# Patient Record
Sex: Male | Born: 1980 | Race: Black or African American | Hispanic: No | Marital: Single | Smoking: Never smoker
Health system: Southern US, Community
[De-identification: ages and names within clinical notes are randomized; demographics above are authoritative.]

---
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
## 2002-05-09 ENCOUNTER — Encounter: Payer: Self-pay | Admitting: Emergency Medicine

## 2002-05-09 ENCOUNTER — Inpatient Hospital Stay (HOSPITAL_COMMUNITY): Admission: EM | Admit: 2002-05-09 | Discharge: 2002-05-10 | Payer: Self-pay | Admitting: *Deleted

## 2006-06-08 ENCOUNTER — Emergency Department (HOSPITAL_COMMUNITY): Admission: EM | Admit: 2006-06-08 | Discharge: 2006-06-08 | Payer: Self-pay | Admitting: Emergency Medicine

## 2008-08-16 ENCOUNTER — Emergency Department (HOSPITAL_COMMUNITY): Admission: EM | Admit: 2008-08-16 | Discharge: 2008-08-16 | Payer: Self-pay | Admitting: *Deleted

## 2010-01-19 ENCOUNTER — Ambulatory Visit: Payer: Self-pay | Admitting: Internal Medicine

## 2010-02-18 ENCOUNTER — Emergency Department (HOSPITAL_COMMUNITY): Admission: EM | Admit: 2010-02-18 | Discharge: 2010-02-19 | Payer: Self-pay | Admitting: Emergency Medicine

## 2010-11-03 ENCOUNTER — Emergency Department (HOSPITAL_COMMUNITY): Payer: No Typology Code available for payment source

## 2010-11-03 ENCOUNTER — Emergency Department (HOSPITAL_COMMUNITY)
Admission: EM | Admit: 2010-11-03 | Discharge: 2010-11-03 | Disposition: A | Discharge status: Home / Self Care | Payer: No Typology Code available for payment source | Attending: Emergency Medicine | Admitting: Emergency Medicine

## 2010-11-03 DIAGNOSIS — M546 Pain in thoracic spine: Secondary | ICD-10-CM | POA: Insufficient documentation

## 2010-11-03 DIAGNOSIS — M545 Low back pain, unspecified: Secondary | ICD-10-CM | POA: Insufficient documentation

## 2010-11-03 DIAGNOSIS — Z79899 Other long term (current) drug therapy: Secondary | ICD-10-CM | POA: Insufficient documentation

## 2010-11-03 DIAGNOSIS — I252 Old myocardial infarction: Secondary | ICD-10-CM | POA: Insufficient documentation

## 2010-11-03 DIAGNOSIS — Z7982 Long term (current) use of aspirin: Secondary | ICD-10-CM | POA: Insufficient documentation

## 2010-11-04 LAB — URINE MICROSCOPIC-ADD ON

## 2010-11-04 LAB — LACTIC ACID, PLASMA: Lactic Acid, Venous: 1 mmol/L (ref 0.5–2.2)

## 2010-11-04 LAB — CBC
MCV: 73.3 fL — ABNORMAL LOW (ref 78.0–100.0)
Platelets: 218 10*3/uL (ref 150–400)
RBC: 5.92 MIL/uL — ABNORMAL HIGH (ref 4.22–5.81)
WBC: 9.2 10*3/uL (ref 4.0–10.5)

## 2010-11-04 LAB — URINALYSIS, ROUTINE W REFLEX MICROSCOPIC
Bilirubin Urine: NEGATIVE
Hgb urine dipstick: NEGATIVE
Ketones, ur: NEGATIVE mg/dL
Protein, ur: 30 mg/dL — AB
Urobilinogen, UA: 1 mg/dL (ref 0.0–1.0)

## 2010-11-04 LAB — RAPID URINE DRUG SCREEN, HOSP PERFORMED
Amphetamines: NOT DETECTED
Barbiturates: NOT DETECTED
Benzodiazepines: NOT DETECTED
Cocaine: NOT DETECTED
Opiates: NOT DETECTED

## 2010-11-04 LAB — POCT CARDIAC MARKERS
CKMB, poc: <1 ng/mL — ABNORMAL LOW (ref 1.0–8.0)
Myoglobin, poc: 56.8 ng/mL (ref 12–200)
Troponin i, poc: <0.05 ng/mL (ref 0.00–0.09)

## 2010-11-04 LAB — DIFFERENTIAL
Eosinophils Relative: 2 % (ref 0–5)
Lymphocytes Relative: 26 % (ref 12–46)
Lymphs Abs: 2.4 10*3/uL (ref 0.7–4.0)
Neutro Abs: 6.1 10*3/uL (ref 1.7–7.7)

## 2010-11-04 LAB — BASIC METABOLIC PANEL
Chloride: 105 mEq/L (ref 96–112)
Creatinine, Ser: 1 mg/dL (ref 0.4–1.5)
GFR calc Af Amer: >60 mL/min (ref >60)
Potassium: 3.8 mEq/L (ref 3.5–5.1)

## 2011-01-04 NOTE — Discharge Summary (Signed)
NAME:  Luis Short, Luis Short                         ACCOUNT NO.:  [id number]   MEDICAL RECORD NO.:  [id number]                   PATIENT TYPE:  INP   LOCATION:  5507                                 FACILITY:  [facility]   PHYSICIAN:  Raymon Mutton, P.A.              DATE OF BIRTH:  October 30, 1980   DATE OF ADMISSION:  05/09/2002  DATE OF DISCHARGE:  05/10/2002                                 DISCHARGE SUMMARY   DISCHARGE DIAGNOSES:  1. Chest pain, resolved at admission, suspected coronary spasm.  2. History of coronary spasm, treated with Norvasc in the past.  3. Status post cardiac catheterization in January 2002 with normal     coronaries.   HISTORY OF PRESENT ILLNESS:  The patient is a 30 year old African-American  gentleman who is a patient of Dr. Tresa Endo. The last time he was seen in our  office in January 2003, and was found to be stable from a cardiovascular  standpoint. The patient was then maintained on a 5 mg daily dose of Norvasc  and he stopped it and also stopped aspirin.   He presented to the emergency room on May 09, 2002, with complaints of  chest pain. At the time of presentation his EKG did not show any changes and  was normal sinus rhythm, and there were some nonspecific STT wave changes in  the precordial leads, V1, V4. The patient was examined by Dr. Tresa Endo and  admitted under the MI protocol.   HOSPITAL COURSE:  He was started on Imdur 30 mg q.d. and developed  significant headache from that so Norvasc 2.5 mg was added, and the patient  the next morning was found pain free with stable vital signs, blood pressure  of 102 systolic over 54, pulse 68, respirations 20, and he was  afebrile.  His O2 saturations were 98% on room air.   His clinical studies, cardiac enzymes x 3 were negative. His hemoglobin was  13, hematocrit 39.7, WBC count 9.6 and platelets 201. His lipid profile was  absolutely favorable. Sodium 142, potassium 4.0, chloride 107, CO2 31, BUN  12,  creatinine 0.9 and serum glucose 79. His urinalysis was unremarkable  except ketones 15.   The patient was assessed by Dr. Tresa Endo and deemed stable for discharge to  home.  His condition on discharge was improved and stable.   DISCHARGE INSTRUCTIONS:  Discharge diet, low fat, low salt, low cholesterol  diet. Discharge activity as tolerated, avoiding  emotional and physical  exertion until seen in the office.   FOLLOW UP:  He will have a Cardiolite  stress test on May 14, 2002, at  11:30 in our  office, and then an appointment with Dr. Lorenz Coaster will follow up  on May 27, 2002, at 4 p.m.    DISCHARGE MEDICATIONS:  1. Norvasc 5 mg p.o. q.d.  2. Enteric coated aspirin 81 mg p.o. q.d.  3. Protonix 40 mg q.d.  Raymon Mutton, P.A.    MK/MEDQ  D:  05/10/2002  T:  05/12/2002  Job:  [id number]

## 2011-01-04 NOTE — Discharge Summary (Signed)
[place]  Patient:    Luis Short, Short                      MRN: [id number] Adm. Date:  [id number] Disc. Date: 08/29/00 Attending:  Virgina Evener Dictator:   Halford Decamp. Delanna Ahmadi, R.N., N.P.                           Discharge Summary  HISTORY OF PRESENT ILLNESS:  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  He apparently had chest pain the day before admission and the day of admission in the morning.  It was recurrent.  It was substernal through to his back.  HOSPITAL COURSE:  An EKG was done and it showed acute MI with J point elevation inferolateral and T wave down precordially.  The pain was resolved after sublingual nitroglycerin x 3.  His enzymes came back which were greatly elevated with a CK of 782 and MB 65, and troponin of 19.73.  Thus he was admitted and he was put on IV heparin and IV nitroglycerin.  He continued to have pain intermittently throughout the night. The morning of August 25, 2000, it was decided that he should undergo cardiac catheterization which was performed by Dr. Tresa Endo.  He had no obstructive CAD.  He had a separate ostia to the LAD and circumflex vessels.  His EF was 57%. He had a subtle posterolateral posterior hypocontractility.  It was felt that symptoms and elevated CKs and EKG were related to a coronary vasospasm. Thus he was placed on calcium channel blocker, aspirin, and Plavix.  A two-dimensional echocardiogram was performed which revealed LV size normal, LV function was normal.  There was no LV wall motion abnormalities.  His valves were all normal.  Right ventricle was normal.  Right atrium was normal.  There was no pericardial effusion and the pericardium appeared to be normal.  He continued to have chest pain after the heart catheterization.  He was given morphine and his IV  nitroglycerin was increased and he was given Toradol.  He did have a component of chest wall tenderness.  His activity level was increased.  On August 29, 2000, he was seen by Runell Gess, M.D. who had a long discussion with him about his diet, any illicit drug use, physical activity, and medication compliance.  He apparently did not want to take his medications in the hospital because he felt they made him sleepy.  Prior to his hospitalization, he had some extreme dietary behaviors secondary to his wrestling competition.  He would cut down his food and take laxatives to try to lose weight.  A drug screen was performed and this was all negative.  He was considered stable upon discharge without any further chest discomfort. His EKG on January 8, showed resolved T wave inversions in V2 and V3.  He continued with some J point elevation in the inferolateral leads.  LABORATORY DATA:  Hemoglobin electrophoresis was performed which was normal. On August 28, 2000, his sodium was 137, potassium 4.0, BUN 11, creatinine 0.9.  Homosystein level was 7.56, cholesterol 65, triglycerides 15, HDL 44, LDL 18.  CK-MB #1 782/65 with troponin 19, #2 696/65 with troponin 23, #3 295/12.8 with troponin of 8.21.  LFTs were within normal limits.  ESR was 1. Sickle  hemoglobin was 14.5 within normal limits.  Sickle screen was negative. His pro time, INR, and PTT were all within normal limits on admission. Magnesium was 1.9 on August 25, 2000.  Iron was 93, TIBC 352, T4 7.4, TSH 4.77. Drug screen was negative.  Urinalysis was negative for any pathology. On admission sodium 141, potassium 3.5, chloride 103, BUN 11.  DISCHARGE MEDICATIONS: 1. Enteric-coated aspirin 325 mg once per day. 2. Norvasc 5 mg once per day. 3. Plavix 75 mg once per day. 4. Nitroglycerin one under the tongue every five minutes if needed for    chest pain.  He may drive.  He is to do no strenuous activity, no physical exercise, and  no wrestling until released and no physical education.  He is to be on a regular diet.  He is to follow up with Dr. Tresa Endo on September 16, 2000, at 3:50 p.m.  DISCHARGE DIAGNOSES: 1. 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 2. Abnormal electrocardiography with J point elevation inferolaterally with    resolved T wave inversions in V2 through V3 at time of discharge. 3. Musculoskeletal chest wall pain. 4. Hypokalemia while in the hospital with replacement and normal levels. 5. One episode of nonsustained supraventricular tachycardia during    hospitalization. 6. Two-dimensional echocardiogram with normal left ventricular function, no    pericardial effusion, normal valves. 7. Status post cardiac catheterization with normal coronary arteries. He    does have an abnormality of separate ostium of his left coronary artery    and circumflex. DD:  08/29/00 TD:  08/29/00 Job: [id number]

## 2011-01-04 NOTE — Cardiovascular Report (Signed)
. [facility]  Patient:    Luis Short, Luis Short                      MRN: [id number] Adm. Date:  [id number] Attending:  Virgina Evener                        Cardiac Catheterization  INDICATIONS:  [name] is an 30 year old black gentleman who is a senior in high school, currently on the wrestling team.  Recently, he has been trying to reduce his weight to wrestle at 112 pounds.  He has been dieting, not eating much at all, and also, there has been some use of laxatives.  On Saturday, August 23, 2000, he developed initial onset of chest pressure; this ultimately resolved.  On Sunday morning at 9 a.m., he developed recurrent chest pressure associated with a smothering sensation.  He continued to experience this chest pain and smothering sensation all day.  At approximately 1 a.m., he presented to the emergency room, greater than 16 hours after persistent chest pain onset.  ECG showed J point elevations/ST segment elevation in leads II, III and V, lead I, V4 through V6, with T wave inversion in leads V1 through V3.  He was given nitroglycerin and ultimately after three sublingual nitroglycerin, chest pain promptly resolved.  Subsequent CPK came back elevated at 782 with an MB fraction of 65.  Troponins were elevated at 19.7.  The patient has been heparinized and had IC nitroglycerin without recurrent chest pain.  Potassium was low at 3.3.  He is taken to the catheterization laboratory to assess for potential obstructive disease/coronary vasospasm or potential anomalous coronary artery.  HEMODYNAMIC DATA:  Central aortic pressure is 112/79; left ventricular pressure 112/25.  ANGIOGRAPHIC DATA:  There were separate ostia to the LAD and circumflex vessels.  The LAD gave rise to a proximal large diagonal vessel.  There were several perforating arteries.  Mid-distal LAD seemed to dip intramyocardially.  The LAD wrapped around the LV apex.  There  was no obstructive disease.  The circumflex vessel had a separate ostium.  It gave rise to one major marginal vessel with several branches.  The circumflex and its branches were normal.  The right coronary artery was a large dominant vessel.  It gave rise to a moderate-size PDA and posterolateral system as well as an acute marginal branch.  There was no significant obstructive disease.  Biplane cine left ventriculography revealed preserved global LV function. Ejection fraction was 57%.  On the RAO projection, there was a very subtle area of very small focal mid-anterolateral and mid-posterior hypocontractility.  On the LAO projection, there appeared to be a subtle area of low-to-mid-posterolateral hypocontractility.  IMPRESSION 1. Preserved global left ventricular function with subtle    posterolateral/posterior hypocontractility. 2. No evidence for obstructive coronary disease, with separate ostia of the    left anterior descending and left circumflex vessel off the left coronary    cusp.  DISCUSSION:  [name] presented with chest pain of greater than 16 hours duration.  He had diffuse J point elevation of approximately 2 mm inferolaterally as well as in lead I; he also had T wave inversion.  There were no prior ECGs to compare and although the J point elevation did suggest the possibility of early repolarization and the T wave inversion may have also been contributed to persistent benign early repolarization; however, with the patients persistent chest pain  and increased CPK with troponins, it was felt more likely that this was due to transient coronary vasospasm leading to symptomatology.  A 2-D echocardiographic Doppler study will also be performed for further evaluation of valvular architecture and pericardium. DD:  08/25/00 TD:  08/25/00 Job: [id number]

## 2011-05-23 LAB — DIFFERENTIAL
Basophils Relative: 0 % (ref 0–1)
Eosinophils Absolute: 0.1 10*3/uL (ref 0.0–0.7)
Eosinophils Relative: 1 % (ref 0–5)
Lymphs Abs: 2.9 10*3/uL (ref 0.7–4.0)
Monocytes Absolute: 0.8 10*3/uL (ref 0.1–1.0)
Monocytes Relative: 8 % (ref 3–12)
Neutrophils Relative %: 62 % (ref 43–77)

## 2011-05-23 LAB — URINE CULTURE: Colony Count: 15000

## 2011-05-23 LAB — CBC
Hemoglobin: 14.5 g/dL (ref 13.0–17.0)
RBC: 6.27 MIL/uL — ABNORMAL HIGH (ref 4.22–5.81)
RDW: 14.5 % (ref 11.5–15.5)

## 2011-05-23 LAB — COMPREHENSIVE METABOLIC PANEL
ALT: 15 U/L (ref 0–53)
AST: 22 U/L (ref 0–37)
Alkaline Phosphatase: 63 U/L (ref 39–117)
Calcium: 9.7 mg/dL (ref 8.4–10.5)
GFR calc Af Amer: >60 mL/min (ref >60)
Glucose, Bld: 89 mg/dL (ref 70–99)
Potassium: 3.6 mEq/L (ref 3.5–5.1)
Sodium: 141 mEq/L (ref 135–145)
Total Protein: 7.1 g/dL (ref 6.0–8.3)

## 2011-05-23 LAB — POCT I-STAT, CHEM 8
Calcium, Ion: 1.17 mmol/L (ref 1.12–1.32)
Creatinine, Ser: 0.9 mg/dL (ref 0.4–1.5)
Glucose, Bld: 87 mg/dL (ref 70–99)
HCT: 49 % (ref 39.0–52.0)
Hemoglobin: 16.7 g/dL (ref 13.0–17.0)
Potassium: 3.5 mEq/L (ref 3.5–5.1)
TCO2: 28 mmol/L (ref 0–100)

## 2011-05-23 LAB — URINALYSIS, ROUTINE W REFLEX MICROSCOPIC
Glucose, UA: NEGATIVE mg/dL
Hgb urine dipstick: NEGATIVE
Ketones, ur: NEGATIVE mg/dL
Protein, ur: NEGATIVE mg/dL
pH: 6 (ref 5.0–8.0)

## 2011-06-10 ENCOUNTER — Emergency Department (HOSPITAL_COMMUNITY)
Admission: EM | Admit: 2011-06-10 | Discharge: 2011-06-11 | Disposition: A | Discharge status: Home / Self Care | Payer: Self-pay | Attending: Emergency Medicine | Admitting: Emergency Medicine

## 2011-06-10 DIAGNOSIS — L299 Pruritus, unspecified: Secondary | ICD-10-CM | POA: Insufficient documentation

## 2011-06-10 DIAGNOSIS — R21 Rash and other nonspecific skin eruption: Secondary | ICD-10-CM | POA: Insufficient documentation

## 2011-06-10 DIAGNOSIS — I252 Old myocardial infarction: Secondary | ICD-10-CM | POA: Insufficient documentation

## 2011-06-26 ENCOUNTER — Encounter: Payer: Self-pay | Admitting: Emergency Medicine

## 2011-06-26 DIAGNOSIS — R21 Rash and other nonspecific skin eruption: Secondary | ICD-10-CM | POA: Insufficient documentation

## 2011-06-26 NOTE — ED Notes (Signed)
Pt c/o rash all over st's was here for same several weeks ago and given RX for same.  Pt st's when he ran out of meds rash returned Pt st's rash burns and stings

## 2011-06-27 ENCOUNTER — Encounter (HOSPITAL_COMMUNITY): Payer: Self-pay | Admitting: Emergency Medicine

## 2011-06-27 ENCOUNTER — Emergency Department (HOSPITAL_COMMUNITY)
Admission: EM | Admit: 2011-06-27 | Discharge: 2011-06-27 | Disposition: A | Discharge status: Home / Self Care | Payer: No Typology Code available for payment source | Attending: Emergency Medicine | Admitting: Emergency Medicine

## 2011-06-27 DIAGNOSIS — R21 Rash and other nonspecific skin eruption: Secondary | ICD-10-CM

## 2011-06-27 MED ORDER — FAMOTIDINE 20 MG PO TABS: 20.0000 mg | ORAL_TABLET | Freq: Two times a day (BID) | ORAL | Status: DC

## 2011-06-27 MED ORDER — CETIRIZINE-PSEUDOEPHEDRINE ER 5-120 MG PO TB12: 1.0000 | ORAL_TABLET | Freq: Every day | ORAL | Status: AC

## 2011-06-27 MED ORDER — FAMOTIDINE 20 MG PO TABS
ORAL_TABLET | ORAL | Status: AC
  Administered 2011-06-27: 20 mg via ORAL
  Filled 2011-06-27: qty 1

## 2011-06-27 MED ORDER — HYDROXYZINE HCL 25 MG PO TABS
25.0000 mg | ORAL_TABLET | Freq: Once | ORAL | Status: AC
  Administered 2011-06-27: 25 mg via ORAL
  Filled 2011-06-27 (×2): qty 1

## 2011-06-27 MED ORDER — FAMOTIDINE 20 MG PO TABS
20.0000 mg | ORAL_TABLET | Freq: Once | ORAL | Status: AC
  Administered 2011-06-27: 20 mg via ORAL
  Filled 2011-06-27: qty 1

## 2011-06-27 MED ORDER — HYDROXYZINE HCL 25 MG PO TABS: 25.0000 mg | ORAL_TABLET | Freq: Four times a day (QID) | ORAL | Status: AC

## 2011-06-27 NOTE — ED Notes (Signed)
Pharmacy notified of medication needed.

## 2011-06-27 NOTE — ED Provider Notes (Signed)
History     CSN: [id number] Arrival date & time: 06/27/2011 12:50 AM   First MD Initiated Contact with Patient 06/27/11 0124      Chief Complaint  Patient presents with  . Rash     HPI History provided by the patient. He presents with continued complaints of rash over entire body. Rash has been present for the past month or longer. Rash is rather small bumps non-erythematous with diffuse itching. Patient had previous visits to the emergency room.  He was treated with a prescription of prednisone and Benadryl which he reports has not helped.  Patient denies fever chills nausea vomiting. No shortness of breath or throat tightness.   History reviewed. No pertinent past medical history.  History reviewed. No pertinent past surgical history.  No family history on file.  History  Substance Use Topics  . Smoking status: Never Smoker   . Smokeless tobacco: Not on file  . Alcohol Use: No      Review of Systems  Constitutional: Negative for fever and chills.  Respiratory: Negative for cough and shortness of breath.   Skin: Positive for rash.  All other systems reviewed and are negative.    Allergies  Review of patient's allergies indicates no known allergies.  Home Medications  No current outpatient prescriptions on file.  BP 117/66  Pulse 67  Temp(Src) 97.7 F (36.5 C) (Oral)  Resp 16  SpO2 99%  Physical Exam  Constitutional: He is oriented to person, place, and time. He appears well-developed and well-nourished. No distress.  HENT:  Head: Normocephalic.  Neck: Normal range of motion. Neck supple.  Cardiovascular: Normal rate.   No murmur heard. Pulmonary/Chest: Effort normal and breath sounds normal. No stridor.  Abdominal: Soft.  Musculoskeletal: Normal range of motion.  Neurological: He is alert and oriented to person, place, and time.  Skin: Skin is warm. Rash noted. No erythema.       Diffuse papular rash over entire body. no erythema.  Psychiatric: He  has a normal mood and affect.    ED Course  Procedures (including critical care time)  Labs Reviewed - No data to display No results found.   1. Rash      MDM          Angus Seller, PA 06/27/11 [phone]

## 2011-06-27 NOTE — ED Provider Notes (Signed)
Medical screening examination/treatment/procedure(s) were performed by non-physician practitioner and as supervising physician I was immediately available for consultation/collaboration.  Alyzah Pelly K Jerusalem Brownstein-Rasch, MD 06/27/11 2320 

## 2012-07-13 ENCOUNTER — Encounter (HOSPITAL_COMMUNITY): Payer: Self-pay | Admitting: *Deleted

## 2012-07-13 ENCOUNTER — Emergency Department (HOSPITAL_COMMUNITY)
Admission: EM | Admit: 2012-07-13 | Discharge: 2012-07-14 | Disposition: A | Discharge status: Home / Self Care | Payer: Self-pay | Attending: Emergency Medicine | Admitting: Emergency Medicine

## 2012-07-13 DIAGNOSIS — A64 Unspecified sexually transmitted disease: Secondary | ICD-10-CM | POA: Insufficient documentation

## 2012-07-13 DIAGNOSIS — R3915 Urgency of urination: Secondary | ICD-10-CM | POA: Insufficient documentation

## 2012-07-13 DIAGNOSIS — R110 Nausea: Secondary | ICD-10-CM | POA: Insufficient documentation

## 2012-07-13 LAB — URINALYSIS, ROUTINE W REFLEX MICROSCOPIC
Bilirubin Urine: NEGATIVE
Hgb urine dipstick: NEGATIVE
Protein, ur: NEGATIVE mg/dL
Urobilinogen, UA: 1 mg/dL (ref 0.0–1.0)

## 2012-07-13 LAB — COMPREHENSIVE METABOLIC PANEL
AST: 29 U/L (ref 0–37)
BUN: 10 mg/dL (ref 6–23)
CO2: 30 mEq/L (ref 19–32)
Calcium: 9.9 mg/dL (ref 8.4–10.5)
Creatinine, Ser: 0.87 mg/dL (ref 0.50–1.35)
GFR calc Af Amer: >90 mL/min (ref >90)
GFR calc non Af Amer: >90 mL/min (ref >90)

## 2012-07-13 LAB — CBC WITH DIFFERENTIAL/PLATELET
Eosinophils Relative: 1 % (ref 0–5)
Lymphs Abs: 2.3 10*3/uL (ref 0.7–4.0)
MCH: 23.7 pg — ABNORMAL LOW (ref 26.0–34.0)
MCV: 71.5 fL — ABNORMAL LOW (ref 78.0–100.0)
Monocytes Absolute: 0.5 10*3/uL (ref 0.1–1.0)
Platelets: UNDETERMINED 10*3/uL (ref 150–400)
RBC: 6.25 MIL/uL — ABNORMAL HIGH (ref 4.22–5.81)
RDW: 14.3 % (ref 11.5–15.5)

## 2012-07-13 LAB — URINE MICROSCOPIC-ADD ON

## 2012-07-13 MED ORDER — IOHEXOL 300 MG/ML  SOLN: 20.0000 mL | INTRAMUSCULAR | Status: DC

## 2012-07-13 MED ORDER — MORPHINE SULFATE 4 MG/ML IJ SOLN
4.0000 mg | Freq: Once | INTRAMUSCULAR | Status: DC
  Filled 2012-07-13: qty 1

## 2012-07-13 NOTE — ED Notes (Signed)
MD at bedside. 

## 2012-07-13 NOTE — ED Provider Notes (Signed)
History     CSN: [id number]  Arrival date & time 07/13/12  2150   First MD Initiated Contact with Patient 07/13/12 2248      Chief Complaint  Patient presents with  . Abdominal Pain    (Consider location/radiation/quality/duration/timing/severity/associated sxs/prior treatment) Patient is a 31 y.o. male presenting with abdominal pain. The history is provided by the patient. No language interpreter was used.  Abdominal Pain The primary symptoms of the illness include abdominal pain and nausea. The primary symptoms of the illness do not include fever, shortness of breath, vomiting, diarrhea or dysuria. The current episode started more than 2 days ago. The onset of the illness was gradual. The problem has been gradually worsening.  Additional symptoms associated with the illness include urgency. Symptoms associated with the illness do not include constipation, hematuria or frequency. Significant associated medical issues include GERD. Significant associated medical issues do not include diabetes or diverticulitis.  R flank and R Lower abdominal pain/pulling intermitant x 2 weeks with urgency.  Patient states he is sexually active but uses condoms.  States he has been tested for stds since he last had intercourse.  Denies penile discharge. Denies N & V or fever.   He thinks he has a UTI today.   History reviewed. No pertinent past medical history.  History reviewed. No pertinent past surgical history.  No family history on file.  History  Substance Use Topics  . Smoking status: Never Smoker   . Smokeless tobacco: Not on file  . Alcohol Use: No      Review of Systems  Constitutional: Negative.  Negative for fever.  HENT: Negative.   Eyes: Negative.   Respiratory: Negative.  Negative for shortness of breath.   Cardiovascular: Negative.   Gastrointestinal: Positive for nausea and abdominal pain. Negative for vomiting, diarrhea and constipation.  Genitourinary: Positive for  urgency. Negative for dysuria, frequency, hematuria, scrotal swelling, difficulty urinating, penile pain and testicular pain.  Musculoskeletal:       Flank pain R  Skin: Negative for rash.  Neurological: Negative.  Negative for dizziness and seizures.  Psychiatric/Behavioral: Negative.   All other systems reviewed and are negative.    Allergies  Review of patient's allergies indicates no known allergies.  Home Medications  No current outpatient prescriptions on file.  BP 123/73  Pulse 68  Temp 98.2 F (36.8 C)  Resp 18  SpO2 100%  Physical Exam  Nursing note and vitals reviewed. Constitutional: He is oriented to person, place, and time. He appears well-developed and well-nourished.  HENT:  Head: Normocephalic.  Eyes: Conjunctivae normal and EOM are normal. Pupils are equal, round, and reactive to light.  Neck: Normal range of motion. Neck supple.  Cardiovascular: Normal rate.   Pulmonary/Chest: Effort normal and breath sounds normal. No respiratory distress.  Abdominal: Soft. Bowel sounds are normal. He exhibits no distension. There is tenderness. There is no rebound and no guarding.  Genitourinary: Penis normal. No penile tenderness.  Musculoskeletal: Normal range of motion.  Neurological: He is alert and oriented to person, place, and time.  Skin: Skin is warm and dry.  Psychiatric: He has a normal mood and affect.    ED Course  Procedures (including critical care time)  Labs Reviewed  URINALYSIS, ROUTINE W REFLEX MICROSCOPIC - Abnormal; Notable for the following:    APPearance CLOUDY (*)     Leukocytes, UA SMALL (*)     All other components within normal limits  CBC WITH DIFFERENTIAL - Abnormal; Notable for  the following:    RBC 6.25 (*)     MCV 71.5 (*)     MCH 23.7 (*)     All other components within normal limits  COMPREHENSIVE METABOLIC PANEL  URINE MICROSCOPIC-ADD ON   No results found.   No diagnosis found.    MDM  Migrating dull lower  abdominal pain x 2 weeks with WBC in his urine.  Treated for std with rocephen, azithromycin and rx for doxycycline.  Patient was instructed to follow up at the free std clinic with no intercourse until recheck and his partner is treated.  He understands to return to the ER for worsening pain, fever, n/v.    Labs Reviewed  URINALYSIS, ROUTINE W REFLEX MICROSCOPIC - Abnormal; Notable for the following:    APPearance CLOUDY (*)     Leukocytes, UA SMALL (*)     All other components within normal limits  CBC WITH DIFFERENTIAL - Abnormal; Notable for the following:    RBC 6.25 (*)     MCV 71.5 (*)     MCH 23.7 (*)     All other components within normal limits  COMPREHENSIVE METABOLIC PANEL  URINE MICROSCOPIC-ADD ON  LAB REPORT - SCANNED          Remi Haggard, NP 07/14/12 1131

## 2012-07-13 NOTE — ED Notes (Signed)
The pt has had abd pain and soreness for 2 weeks.  He thinks he has a urinary iinfection.  No nv or diarrhea

## 2012-07-14 MED ORDER — AZITHROMYCIN 250 MG PO TABS
1000.0000 mg | ORAL_TABLET | Freq: Once | ORAL | Status: AC
  Administered 2012-07-14: 1000 mg via ORAL
  Filled 2012-07-14: qty 1

## 2012-07-14 MED ORDER — CEFTRIAXONE SODIUM 250 MG IJ SOLR
250.0000 mg | Freq: Once | INTRAMUSCULAR | Status: AC
  Administered 2012-07-14: 250 mg via INTRAMUSCULAR
  Filled 2012-07-14: qty 250

## 2012-07-14 MED ORDER — LIDOCAINE HCL (PF) 1 % IJ SOLN
INTRAMUSCULAR | Status: AC
  Administered 2012-07-14: 0.09 mL
  Filled 2012-07-14: qty 5

## 2012-07-14 MED ORDER — DOXYCYCLINE HYCLATE 100 MG PO CAPS: 100.0000 mg | ORAL_CAPSULE | Freq: Two times a day (BID) | ORAL | Status: DC

## 2012-07-14 NOTE — ED Provider Notes (Signed)
Medical screening examination/treatment/procedure(s) were performed by non-physician practitioner and as supervising physician I was immediately available for consultation/collaboration.  Cheri Guppy, MD 07/14/12 [phone]

## 2012-07-14 NOTE — ED Notes (Signed)
Patient is alert and orientedx4.  Patient was explained discharge instructions and he understood them with no questions. 

## 2012-07-14 NOTE — ED Provider Notes (Signed)
Medical screening examination/treatment/procedure(s) were performed by non-physician practitioner and as supervising physician I was immediately available for consultation/collaboration.  Tyan Lasure, MD 07/14/12 0650 

## 2013-12-13 ENCOUNTER — Encounter (HOSPITAL_COMMUNITY): Payer: Self-pay | Admitting: Emergency Medicine

## 2013-12-13 ENCOUNTER — Emergency Department (HOSPITAL_COMMUNITY)
Admission: EM | Admit: 2013-12-13 | Discharge: 2013-12-13 | Disposition: A | Discharge status: Home / Self Care | Payer: Self-pay | Attending: Emergency Medicine | Admitting: Emergency Medicine

## 2013-12-13 ENCOUNTER — Emergency Department (HOSPITAL_COMMUNITY): Payer: Self-pay

## 2013-12-13 DIAGNOSIS — M62838 Other muscle spasm: Secondary | ICD-10-CM | POA: Insufficient documentation

## 2013-12-13 MED ORDER — KETOROLAC TROMETHAMINE 60 MG/2ML IM SOLN
60.0000 mg | Freq: Once | INTRAMUSCULAR | Status: AC
  Administered 2013-12-13: 60 mg via INTRAMUSCULAR
  Filled 2013-12-13: qty 2

## 2013-12-13 MED ORDER — METHOCARBAMOL 500 MG PO TABS
1000.0000 mg | ORAL_TABLET | Freq: Once | ORAL | Status: AC
  Administered 2013-12-13: 1000 mg via ORAL
  Filled 2013-12-13: qty 2

## 2013-12-13 MED ORDER — TRAMADOL HCL 50 MG PO TABS: 50.0000 mg | ORAL_TABLET | Freq: Four times a day (QID) | ORAL | Status: DC | PRN

## 2013-12-13 MED ORDER — IBUPROFEN 800 MG PO TABS: 800.0000 mg | ORAL_TABLET | Freq: Three times a day (TID) | ORAL | Status: DC

## 2013-12-13 MED ORDER — METHOCARBAMOL 500 MG PO TABS: 500.0000 mg | ORAL_TABLET | Freq: Two times a day (BID) | ORAL | Status: DC

## 2013-12-13 NOTE — ED Notes (Signed)
The pt is c/o numbness and tingling in  Both arms legs feet  For one month.  No known injury

## 2013-12-13 NOTE — ED Provider Notes (Addendum)
CSN: [id number]     Arrival date & time 12/13/13  0043 History   First MD Initiated Contact with Patient 12/13/13 0118     Chief Complaint  Patient presents with  . Numbness     (Consider location/radiation/quality/duration/timing/severity/associated sxs/prior Treatment) The history is provided by the patient. No language interpreter was used.  Patient complains of pain along the border of the right trapezius muscle and paresthesias of the entire body x 1 month.  No weakness.  No bowel or bladder incontinence.  No changes in vision or speech no CP, No SOB.  No trauma.  No HA.  No IVDA.    History reviewed. No pertinent past medical history. History reviewed. No pertinent past surgical history. No family history on file. History  Substance Use Topics  . Smoking status: Never Smoker   . Smokeless tobacco: Not on file  . Alcohol Use: No    Review of Systems  Genitourinary: Negative for dysuria and difficulty urinating.  Musculoskeletal: Negative for arthralgias, back pain, gait problem, joint swelling, myalgias and neck stiffness.  Skin: Negative for rash.  Neurological: Negative for dizziness, tremors, syncope, facial asymmetry, speech difficulty, weakness, light-headedness and headaches.  All other systems reviewed and are negative.     Allergies  Review of patient's allergies indicates no known allergies.  Home Medications   Prior to Admission medications   Not on File   BP 112/72  Pulse 60  Temp(Src) 98.2 F (36.8 C) (Oral)  Resp 18  SpO2 97% Physical Exam  Constitutional: He is oriented to person, place, and time. He appears well-developed and well-nourished. No distress.  HENT:  Head: Normocephalic and atraumatic.  Mouth/Throat: Oropharynx is clear and moist.  Eyes: Conjunctivae and EOM are normal. Pupils are equal, round, and reactive to light.  Neck: Normal range of motion. Neck supple.  Spasm in the right trapezius muscle  Cardiovascular: Normal rate,  regular rhythm and intact distal pulses.   Pulmonary/Chest: Effort normal and breath sounds normal. He has no wheezes. He has no rales.  Abdominal: Soft. Bowel sounds are normal. There is no tenderness. There is no rebound and no guarding.  Musculoskeletal: Normal range of motion. He exhibits no edema and no tenderness.  Lymphadenopathy:    He has no cervical adenopathy.  Neurological: He is alert and oriented to person, place, and time. He has normal reflexes. He displays normal reflexes. No cranial nerve deficit. He exhibits normal muscle tone. Coordination normal.  L5/ s1 intact intact perineal sensation.  Intact gait.  5/5 strength in all 4 extremities.  Sensation tested in all 4 extremities and intact.    Skin: Skin is warm and dry.  Psychiatric: He has a normal mood and affect.    ED Course  Procedures (including critical care time) Labs Review Labs Reviewed - No data to display  Imaging Review Dg Cervical Spine Complete  12/13/2013   CLINICAL DATA:  Numbness and tingling in both arms, and in the legs and feet. Right neck pain.  EXAM: CERVICAL SPINE  4+ VIEWS  COMPARISON:  CT of the cervical spine performed 06/08/2006  FINDINGS: There is no evidence of fracture or subluxation. Vertebral bodies demonstrate normal height and alignment. Intervertebral disc spaces are preserved. Prevertebral soft tissues are within normal limits. The provided odontoid view demonstrates no significant abnormality.  The visualized lung apices are clear.  IMPRESSION: No evidence of fracture or subluxation along the cervical spine.   Electronically Signed   By: Beryle BeamsJeffery  Chang M.D.  On: 12/13/2013 01:37     EKG Interpretation None      MDM   Final diagnoses:  None    No high risk behavior, no trauma.  No fevers, no weight loss.  No focal deficits. Exam and vitals benign and reassuring.  No vision loss No eye pain.  No indication for MRI at this time.  Follow up with your family doctor will treat for  muscle spasm.  Return for weakness, inability to pass urine or stool, fevers, or vision or speech changes    Tritia Endo K Reford Olliff-Rasch, MD 12/13/13 0151  Brooklynne Pereida K Bobbie Virden-Rasch, MD 12/13/13 [phone]

## 2013-12-13 NOTE — ED Notes (Signed)
Pain in his rt neck also

## 2014-06-16 ENCOUNTER — Emergency Department (HOSPITAL_COMMUNITY): Payer: No Typology Code available for payment source

## 2014-06-16 ENCOUNTER — Encounter (HOSPITAL_COMMUNITY): Payer: Self-pay | Admitting: Emergency Medicine

## 2014-06-16 ENCOUNTER — Emergency Department (HOSPITAL_COMMUNITY)
Admission: EM | Admit: 2014-06-16 | Discharge: 2014-06-16 | Disposition: A | Discharge status: Home / Self Care | Payer: No Typology Code available for payment source | Attending: Emergency Medicine | Admitting: Emergency Medicine

## 2014-06-16 DIAGNOSIS — R202 Paresthesia of skin: Secondary | ICD-10-CM | POA: Insufficient documentation

## 2014-06-16 DIAGNOSIS — H538 Other visual disturbances: Secondary | ICD-10-CM | POA: Insufficient documentation

## 2014-06-16 DIAGNOSIS — R0789 Other chest pain: Secondary | ICD-10-CM | POA: Insufficient documentation

## 2014-06-16 LAB — BASIC METABOLIC PANEL
Anion gap: 10 (ref 5–15)
BUN: 14 mg/dL (ref 6–23)
CHLORIDE: 103 meq/L (ref 96–112)
CO2: 28 meq/L (ref 19–32)
Calcium: 9.2 mg/dL (ref 8.4–10.5)
Creatinine, Ser: 1.02 mg/dL (ref 0.50–1.35)
GFR calc Af Amer: >90 mL/min (ref >90)
GFR calc non Af Amer: >90 mL/min (ref >90)
GLUCOSE: 92 mg/dL (ref 70–99)
POTASSIUM: 3.9 meq/L (ref 3.7–5.3)
Sodium: 141 mEq/L (ref 137–147)

## 2014-06-16 LAB — URINALYSIS, ROUTINE W REFLEX MICROSCOPIC
Bilirubin Urine: NEGATIVE
GLUCOSE, UA: NEGATIVE mg/dL
HGB URINE DIPSTICK: NEGATIVE
Ketones, ur: 15 mg/dL — AB
Leukocytes, UA: NEGATIVE
Nitrite: NEGATIVE
PH: 6.5 (ref 5.0–8.0)
PROTEIN: NEGATIVE mg/dL
SPECIFIC GRAVITY, URINE: 1.033 — AB (ref 1.005–1.030)
Urobilinogen, UA: 1 mg/dL (ref 0.0–1.0)

## 2014-06-16 LAB — PROTIME-INR
INR: 1.22 (ref 0.00–1.49)
PROTHROMBIN TIME: 15.6 s — AB (ref 11.6–15.2)

## 2014-06-16 LAB — CBC
HEMATOCRIT: 40.1 % (ref 39.0–52.0)
HEMOGLOBIN: 13 g/dL (ref 13.0–17.0)
MCH: 23 pg — ABNORMAL LOW (ref 26.0–34.0)
MCHC: 32.4 g/dL (ref 30.0–36.0)
MCV: 70.8 fL — AB (ref 78.0–100.0)
Platelets: 201 10*3/uL (ref 150–400)
RBC: 5.66 MIL/uL (ref 4.22–5.81)
RDW: 14.1 % (ref 11.5–15.5)
WBC: 9.6 10*3/uL (ref 4.0–10.5)

## 2014-06-16 LAB — RAPID URINE DRUG SCREEN, HOSP PERFORMED
AMPHETAMINES: NOT DETECTED
Barbiturates: NOT DETECTED
Benzodiazepines: NOT DETECTED
Cocaine: NOT DETECTED
OPIATES: NOT DETECTED
Tetrahydrocannabinol: NOT DETECTED

## 2014-06-16 LAB — I-STAT TROPONIN, ED: TROPONIN I, POC: 0 ng/mL (ref 0.00–0.08)

## 2014-06-16 LAB — PRO B NATRIURETIC PEPTIDE: Pro B Natriuretic peptide (BNP): 7.9 pg/mL (ref 0–125)

## 2014-06-16 MED ORDER — NITROGLYCERIN 0.4 MG SL SUBL
0.4000 mg | SUBLINGUAL_TABLET | SUBLINGUAL | Status: DC | PRN
  Administered 2014-06-16: 0.4 mg via SUBLINGUAL
  Filled 2014-06-16: qty 1

## 2014-06-16 MED ORDER — KETOROLAC TROMETHAMINE 30 MG/ML IJ SOLN
30.0000 mg | Freq: Once | INTRAMUSCULAR | Status: AC
  Administered 2014-06-16: 30 mg via INTRAVENOUS
  Filled 2014-06-16: qty 1

## 2014-06-16 MED ORDER — ASPIRIN 325 MG PO TABS
325.0000 mg | ORAL_TABLET | ORAL | Status: AC
  Administered 2014-06-16: 325 mg via ORAL
  Filled 2014-06-16: qty 1

## 2014-06-16 NOTE — ED Provider Notes (Signed)
CSN: [id number]     Arrival date & time 06/16/14  0132 History   First MD Initiated Contact with Patient 06/16/14 0149     Chief Complaint  Patient presents with  . Chest Pain  . Blurred Vision     (Consider location/radiation/quality/duration/timing/severity/associated sxs/prior Treatment) HPI  This is a 33 year old male with no significant past medical history who presents with multiple complaints. Patient reports pains and paresthesias in the bilateral upper and lower extremities. This is been ongoing for approximately 2 weeks. He denies any headache. Patient reports chest pain and "feeling congested." He reports that it is a pulling sensation. It is mostly right-sided and nonradiating. Currently pain is 7 out of 10. No exertional component. Denies cough or fever. Patient also reports blurry vision. Denies any double vision.  History reviewed. No pertinent past medical history. History reviewed. No pertinent past surgical history. Family History  Problem Relation Age of Onset  . Cancer Mother   . Diabetes Mother   . Hypertension Father   . Hypertension Brother    History  Substance Use Topics  . Smoking status: Never Smoker   . Smokeless tobacco: Never Used  . Alcohol Use: No    Review of Systems  Constitutional: Negative.  Negative for fever and chills.  Eyes: Negative for visual disturbance.  Respiratory: Positive for chest tightness. Negative for shortness of breath.   Cardiovascular: Negative.  Negative for chest pain and leg swelling.  Gastrointestinal: Negative.  Negative for nausea, vomiting, abdominal pain and diarrhea.  Genitourinary: Negative.  Negative for dysuria.  Musculoskeletal: Negative for back pain.  Skin: Negative for rash.  Neurological: Positive for numbness. Negative for dizziness, syncope, speech difficulty, weakness, light-headedness and headaches.  All other systems reviewed and are negative.     Allergies  Review of patient's allergies  indicates no known allergies.  Home Medications   Prior to Admission medications   Not on File   BP 116/78  Pulse 59  Temp(Src) 99.2 F (37.3 C) (Oral)  Resp 14  Ht 5\' 7"  (1.702 m)  Wt 159 lb (72.122 kg)  BMI 24.90 kg/m2  SpO2 100% Physical Exam  Nursing note and vitals reviewed. Constitutional: He is oriented to person, place, and time. He appears well-developed and well-nourished. No distress.  HENT:  Head: Normocephalic and atraumatic.  Eyes: EOM are normal. Pupils are equal, round, and reactive to light.  Cardiovascular: Normal rate, regular rhythm and normal heart sounds.   No murmur heard. Pulmonary/Chest: Effort normal and breath sounds normal. No respiratory distress. He has no wheezes. He exhibits no tenderness.  Abdominal: Soft. Bowel sounds are normal. There is no tenderness. There is no rebound and no guarding.  Musculoskeletal: He exhibits no edema.  Neurological: He is alert and oriented to person, place, and time.  5 out of 5 strength in all 4 extremities, sensation intact, visual fields intact  Skin: Skin is warm and dry.  Psychiatric: He has a normal mood and affect.    ED Course  Procedures (including critical care time) Labs Review Labs Reviewed  CBC - Abnormal; Notable for the following:    MCV 70.8 (*)    MCH 23.0 (*)    All other components within normal limits  PROTIME-INR - Abnormal; Notable for the following:    Prothrombin Time 15.6 (*)    All other components within normal limits  URINALYSIS, ROUTINE W REFLEX MICROSCOPIC - Abnormal; Notable for the following:    APPearance CLOUDY (*)    Specific  Gravity, Urine 1.033 (*)    Ketones, ur 15 (*)    All other components within normal limits  BASIC METABOLIC PANEL  PRO B NATRIURETIC PEPTIDE  URINE RAPID DRUG SCREEN (HOSP PERFORMED)  I-STAT TROPOININ, ED    Imaging ReviewRosezena Sensor Dg Chest Port 1 View  06/16/2014   CLINICAL DATA:  Right-sided chest pain and numbness.  EXAM: PORTABLE CHEST - 1 VIEW   COMPARISON:  06/08/2006  FINDINGS: Normal heart size and mediastinal contours. No acute infiltrate or edema. No effusion or pneumothorax. No acute osseous findings.  IMPRESSION: No active disease.   Electronically Signed   By: Tiburcio PeaJonathan  Watts M.D.   On: 06/16/2014 02:48     EKG Interpretation   Date/Time:  Thursday June 16 2014 01:38:50 EDT Ventricular Rate:  79 PR Interval:  186 QRS Duration: 88 QT Interval:  366 QTC Calculation: 419 R Axis:   81 Text Interpretation:  Sinus rhythm with occasional Premature ventricular  complexes Abnormal ECG No significant change was found Confirmed by CAMPOS   MD, KEVIN ([id number]) on 06/16/2014 1:40:31 AM      MDM   Final diagnoses:  Paresthesia  Other chest pain    Patient presents with multiple complaints including paresthesias and chest pain. They have been ongoing for the last 2 weeks. EKG shows diffuse ST elevation but is unchanged from prior. He is nontoxic on exam. Chest x-ray and workup is largely reassuring. No obvious focal neurologic deficit. Visual acuity checked and normal.  On recheck, patient resting comfortably. Without complaint. He does not have a primary physician. At this time unsure the etiology of the patient's complaints but do not find an acute medical emergency. We'll have him follow-up with [facility].  After history, exam, and medical workup I feel the patient has been appropriately medically screened and is safe for discharge home. Pertinent diagnoses were discussed with the patient. Patient was given return precautions.     Shon Batonourtney F Horton, MD 06/16/14 [phone]

## 2014-06-16 NOTE — ED Notes (Signed)
Patient presents stating he has been having numbness and tingling in his arms and legs for about 2 weeks.  Also noticed his vision has been getting blurry.  Tonight stated he was getting up to get something and his left chest started hurting more.  Has been uncomfortable for the past 2 weeks but seemed to be worse tonight.

## 2014-06-16 NOTE — ED Notes (Addendum)
No complaints voiced at this time.

## 2014-06-16 NOTE — Discharge Instructions (Signed)
Chest Pain (Nonspecific) °It is often hard to give a specific diagnosis for the cause of chest pain. There is always a chance that your pain could be related to something serious, such as a heart attack or a blood clot in the lungs. You need to follow up with your health care provider for further evaluation. °CAUSES  °· Heartburn. °· Pneumonia or bronchitis. °· Anxiety or stress. °· Inflammation around your heart (pericarditis) or lung (pleuritis or pleurisy). °· A blood clot in the lung. °· A collapsed lung (pneumothorax). It can develop suddenly on its own (spontaneous pneumothorax) or from trauma to the chest. °· Shingles infection (herpes zoster virus). °The chest wall is composed of bones, muscles, and cartilage. Any of these can be the source of the pain. °· The bones can be bruised by injury. °· The muscles or cartilage can be strained by coughing or overwork. °· The cartilage can be affected by inflammation and become sore (costochondritis). °DIAGNOSIS  °Lab tests or other studies may be needed to find the cause of your pain. Your health care provider may have you take a test called an ambulatory electrocardiogram (ECG). An ECG records your heartbeat patterns over a 24-hour period. You may also have other tests, such as: °· Transthoracic echocardiogram (TTE). During echocardiography, sound waves are used to evaluate how blood flows through your heart. °· Transesophageal echocardiogram (TEE). °· Cardiac monitoring. This allows your health care provider to monitor your heart rate and rhythm in real time. °· Holter monitor. This is a portable device that records your heartbeat and can help diagnose heart arrhythmias. It allows your health care provider to track your heart activity for several days, if needed. °· Stress tests by exercise or by giving medicine that makes the heart beat faster. °TREATMENT  °· Treatment depends on what may be causing your chest pain. Treatment may include: °¨ Acid blockers for  heartburn. °¨ Anti-inflammatory medicine. °¨ Pain medicine for inflammatory conditions. °¨ Antibiotics if an infection is present. °· You may be advised to change lifestyle habits. This includes stopping smoking and avoiding alcohol, caffeine, and chocolate. °· You may be advised to keep your head raised (elevated) when sleeping. This reduces the chance of acid going backward from your stomach into your esophagus. °Most of the time, nonspecific chest pain will improve within 2-3 days with rest and mild pain medicine.  °HOME CARE INSTRUCTIONS  °· If antibiotics were prescribed, take them as directed. Finish them even if you start to feel better. °· For the next few days, avoid physical activities that bring on chest pain. Continue physical activities as directed. °· Do not use any tobacco products, including cigarettes, chewing tobacco, or electronic cigarettes. °· Avoid drinking alcohol. °· Only take medicine as directed by your health care provider. °· Follow your health care provider's suggestions for further testing if your chest pain does not go away. °· Keep any follow-up appointments you made. If you do not go to an appointment, you could develop lasting (chronic) problems with pain. If there is any problem keeping an appointment, call to reschedule. °SEEK MEDICAL CARE IF:  °· Your chest pain does not go away, even after treatment. °· You have a rash with blisters on your chest. °· You have a fever. °SEEK IMMEDIATE MEDICAL CARE IF:  °· You have increased chest pain or pain that spreads to your arm, neck, jaw, back, or abdomen. °· You have shortness of breath. °· You have an increasing cough, or you cough   up blood. °· You have severe back or abdominal pain. °· You feel nauseous or vomit. °· You have severe weakness. °· You faint. °· You have chills. °This is an emergency. Do not wait to see if the pain will go away. Get medical help at once. Call your local emergency services (911 in [place] Do not drive  yourself to the hospital. °MAKE SURE YOU:  °· Understand these instructions. °· Will watch your condition. °· Will get help right away if you are not doing well or get worse. °Document Released: 05/15/2005 Document Revised: 08/10/2013 Document Reviewed: 03/10/2008 °[employer]® Patient Information ©2015 [employer]. This information is not intended to replace advice given to you by your health care provider. Make sure you discuss any questions you have with your health care provider. °Paresthesia °Paresthesia is an abnormal burning or prickling sensation. This sensation is generally felt in the hands, arms, legs, or feet. However, it may occur in any part of the body. It is usually not painful. The feeling may be described as: °· Tingling or numbness. °· "Pins and needles." °· Skin crawling. °· Buzzing. °· Limbs "falling asleep." °· Itching. °Most people experience temporary (transient) paresthesia at some time in their lives. °CAUSES  °Paresthesia may occur when you breathe too quickly (hyperventilation). It can also occur without any apparent cause. Commonly, paresthesia occurs when pressure is placed on a nerve. The feeling quickly goes away once the pressure is removed. For some people, however, paresthesia is a long-lasting (chronic) condition caused by an underlying disorder. The underlying disorder may be: °· A traumatic, direct injury to nerves. Examples include a: °· Broken (fractured) neck. °· Fractured skull. °· A disorder affecting the brain and spinal cord (central nervous system). Examples include: °· Transverse myelitis. °· Encephalitis. °· Transient ischemic attack. °· Multiple sclerosis. °· Stroke. °· Tumor or blood vessel problems, such as an arteriovenous malformation pressing against the brain or spinal cord. °· A condition that damages the peripheral nerves (peripheral neuropathy). Peripheral nerves are not part of the brain and spinal cord. These conditions include: °· Diabetes. °· Peripheral  vascular disease. °· Nerve entrapment syndromes, such as carpal tunnel syndrome. °· Shingles. °· Hypothyroidism. °· Vitamin B12 deficiencies. °· Alcoholism. °· Heavy metal poisoning (lead, arsenic). °· Rheumatoid arthritis. °· Systemic lupus erythematosus. °DIAGNOSIS  °Your caregiver will attempt to find the underlying cause of your paresthesia. Your caregiver may: °· Take your medical history. °· Perform a physical exam. °· Order various lab tests. °· Order imaging tests. °TREATMENT  °Treatment for paresthesia depends on the underlying cause. °HOME CARE INSTRUCTIONS °· Avoid drinking alcohol. °· You may consider massage or acupuncture to help relieve your symptoms. °· Keep all follow-up appointments as directed by your caregiver. °SEEK IMMEDIATE MEDICAL CARE IF:  °· You feel weak. °· You have trouble walking or moving. °· You have problems with speech or vision. °· You feel confused. °· You cannot control your bladder or bowel movements. °· You feel numbness after an injury. °· You faint. °· Your burning or prickling feeling gets worse when walking. °· You have pain, cramps, or dizziness. °· You develop a rash. °MAKE SURE YOU: °· Understand these instructions. °· Will watch your condition. °· Will get help right away if you are not doing well or get worse. °Document Released: 07/26/2002 Document Revised: 10/28/2011 Document Reviewed: 04/26/2011 °ExitCare® Patient Information ©2015 [employer]. This information is not intended to replace advice given to you by your health care provider. Make sure you   discuss any questions you have with your health care provider. ° °

## 2014-06-16 NOTE — ED Notes (Signed)
Discharge instructions given  Voiced understanding.   

## 2014-06-16 NOTE — ED Notes (Signed)
Pt. reports right chest pain with mild SOB and tingling at fingers and toes onset 2 weeks ago. Denies nausea or diaphoresis .

## 2014-07-16 ENCOUNTER — Emergency Department (HOSPITAL_COMMUNITY)
Admission: EM | Admit: 2014-07-16 | Discharge: 2014-07-16 | Disposition: A | Discharge status: Home / Self Care | Payer: No Typology Code available for payment source | Attending: Emergency Medicine | Admitting: Emergency Medicine

## 2014-07-16 ENCOUNTER — Encounter (HOSPITAL_COMMUNITY): Payer: Self-pay | Admitting: *Deleted

## 2014-07-16 DIAGNOSIS — R51 Headache: Secondary | ICD-10-CM | POA: Insufficient documentation

## 2014-07-16 DIAGNOSIS — H539 Unspecified visual disturbance: Secondary | ICD-10-CM | POA: Insufficient documentation

## 2014-07-16 DIAGNOSIS — R202 Paresthesia of skin: Secondary | ICD-10-CM | POA: Insufficient documentation

## 2014-07-16 DIAGNOSIS — M545 Low back pain: Secondary | ICD-10-CM | POA: Insufficient documentation

## 2014-07-16 NOTE — ED Provider Notes (Signed)
CSN: [id number]     Arrival date & time 07/16/14  0154 History   First MD Initiated Contact with Patient 07/16/14 0217     Chief Complaint  Patient presents with  . multiple symptoms      (Consider location/radiation/quality/duration/timing/severity/associated sxs/prior Treatment) HPI Comments: He returns to the ED for complaints of back and chest pain, sharp tingling sensation in the arms and legs, cloudy vision and headache for the past 3 weeks. Initially seen on 06/16/14 for same, discharged home with "no diagnosis". He has not had any outpatient follow up. No fever, vomiting, cough, diarrhea, congestion or sore throat.   The history is provided by the patient. No language interpreter was used.    History reviewed. No pertinent past medical history. History reviewed. No pertinent past surgical history. Family History  Problem Relation Age of Onset  . Cancer Mother   . Diabetes Mother   . Hypertension Father   . Hypertension Brother    History  Substance Use Topics  . Smoking status: Never Smoker   . Smokeless tobacco: Never Used  . Alcohol Use: No    Review of Systems  Constitutional: Negative for fever and chills.  Eyes: Positive for visual disturbance.       See HPI.  Respiratory: Negative.   Cardiovascular: Negative.   Gastrointestinal: Negative.  Negative for nausea and abdominal pain.  Musculoskeletal:       See HPI.  Skin: Negative.  Negative for color change and rash.  Neurological: Positive for headaches.       See HPI.      Allergies  Review of patient's allergies indicates no known allergies.  Home Medications   Prior to Admission medications   Not on File   BP 113/84 mmHg  Pulse 72  Temp(Src) 98 F (36.7 C) (Oral)  Resp 17  Ht 5\' 7"  (1.702 m)  Wt 150 lb (68.04 kg)  BMI 23.49 kg/m2  SpO2 100% Physical Exam  Constitutional: He is oriented to person, place, and time. He appears well-developed and well-nourished.  HENT:  Head:  Normocephalic and atraumatic.  Eyes: EOM are normal. Pupils are equal, round, and reactive to light.  Neck: Normal range of motion.  Cardiovascular: Normal rate and regular rhythm.   No murmur heard. Pulmonary/Chest: Effort normal and breath sounds normal. He has no wheezes. He has no rales.  Abdominal: Soft. There is no tenderness.  Musculoskeletal: He exhibits no edema.  Neurological: He is alert and oriented to person, place, and time. He has normal strength and normal reflexes. No sensory deficit. He displays a negative Romberg sign.  CN's 3-12 grossly intact. Romberg negative, no pronator drift. Equal sensation bilateral upper and lower extremities. Full strength without lateralizing deficits. Normal coordination. Ambulatory without ataxia.   Skin: Skin is warm and dry.  Psychiatric: He has a normal mood and affect.    ED Course  Procedures (including critical care time) Labs Review Labs Reviewed - No data to display  Imaging Review No results found.   EKG Interpretation None      MDM   Final diagnoses:  None    1. Paresthesia  VSS. Nomal neurologic exam without abnormalities. Symptoms for 3 weeks in otherwise healthy patient. No objective evidence concerning for acute event. Stable for discharge home and outpatient follow up.    Arnoldo HookerShari A Makailee Nudelman, PA-C 07/16/14 0400  Purvis SheffieldForrest Harrison, MD 07/16/14 [phone]

## 2014-07-16 NOTE — ED Notes (Signed)
PA at bedside.

## 2014-07-16 NOTE — Discharge Instructions (Signed)
Paresthesia °Paresthesia is an abnormal burning or prickling sensation. This sensation is generally felt in the hands, arms, legs, or feet. However, it may occur in any part of the body. It is usually not painful. The feeling may be described as: °· Tingling or numbness. °· "Pins and needles." °· Skin crawling. °· Buzzing. °· Limbs "falling asleep." °· Itching. °Most people experience temporary (transient) paresthesia at some time in their lives. °CAUSES  °Paresthesia may occur when you breathe too quickly (hyperventilation). It can also occur without any apparent cause. Commonly, paresthesia occurs when pressure is placed on a nerve. The feeling quickly goes away once the pressure is removed. For some people, however, paresthesia is a long-lasting (chronic) condition caused by an underlying disorder. The underlying disorder may be: °· A traumatic, direct injury to nerves. Examples include a: °¨ Broken (fractured) neck. °¨ Fractured skull. °· A disorder affecting the brain and spinal cord (central nervous system). Examples include: °¨ Transverse myelitis. °¨ Encephalitis. °¨ Transient ischemic attack. °¨ Multiple sclerosis. °¨ Stroke. °¨ Tumor or blood vessel problems, such as an arteriovenous malformation pressing against the brain or spinal cord. °· A condition that damages the peripheral nerves (peripheral neuropathy). Peripheral nerves are not part of the brain and spinal cord. These conditions include: °¨ Diabetes. °¨ Peripheral vascular disease. °¨ Nerve entrapment syndromes, such as carpal tunnel syndrome. °¨ Shingles. °¨ Hypothyroidism. °¨ Vitamin B12 deficiencies. °¨ Alcoholism. °¨ Heavy metal poisoning (lead, arsenic). °¨ Rheumatoid arthritis. °¨ Systemic lupus erythematosus. °DIAGNOSIS  °Your caregiver will attempt to find the underlying cause of your paresthesia. Your caregiver may: °· Take your medical history. °· Perform a physical exam. °· Order various lab tests. °· Order imaging tests. °TREATMENT    °Treatment for paresthesia depends on the underlying cause. °HOME CARE INSTRUCTIONS °· Avoid drinking alcohol. °· You may consider massage or acupuncture to help relieve your symptoms. °· Keep all follow-up appointments as directed by your caregiver. °SEEK IMMEDIATE MEDICAL CARE IF:  °· You feel weak. °· You have trouble walking or moving. °· You have problems with speech or vision. °· You feel confused. °· You cannot control your bladder or bowel movements. °· You feel numbness after an injury. °· You faint. °· Your burning or prickling feeling gets worse when walking. °· You have pain, cramps, or dizziness. °· You develop a rash. °MAKE SURE YOU: °· Understand these instructions. °· Will watch your condition. °· Will get help right away if you are not doing well or get worse. °Document Released: 07/26/2002 Document Revised: 10/28/2011 Document Reviewed: 04/26/2011 °ExitCare® Patient Information ©2015 [employer]. This information is not intended to replace advice given to you by your health care provider. Make sure you discuss any questions you have with your health care provider. ° ° °Emergency Department Resource Guide °1) Find a Doctor and Pay Out of Pocket °Although you won't have to find out who is covered by your insurance plan, it is a good idea to ask around and get recommendations. You will then need to call the office and see if the doctor you have chosen will accept you as a new patient and what types of options they offer for patients who are self-pay. Some doctors offer discounts or will set up payment plans for their patients who do not have insurance, but you will need to ask so you aren't surprised when you get to your appointment. ° °2) Contact Your Local Health Department °Not all health departments have doctors that can see   patients for sick visits, but many do, so it is worth a call to see if yours does. If you don't know where your local health department is, you can check in your phone  book. The CDC also has a tool to help you locate your state's health department, and many state websites also have listings of all of their local health departments. ° °3) Find a Walk-in Clinic °If your illness is not likely to be very severe or complicated, you may want to try a walk in clinic. These are popping up all over the country in pharmacies, drugstores, and shopping centers. They're usually staffed by nurse practitioners or physician assistants that have been trained to treat common illnesses and complaints. They're usually fairly quick and inexpensive. However, if you have serious medical issues or chronic medical problems, these are probably not your best option. ° °No Primary Care Doctor: °- Call [facility] at  832-8000 - they can help you locate a primary care doctor that  accepts your insurance, provides certain services, etc. °- Physician Referral Service- 1-800-533-3463 ° °Chronic Pain Problems: °Organization         Address  Phone   Notes  °[facility]  [phone] Patients need to be referred by their primary care doctor.  ° °Medication Assistance: °Organization         Address  Phone   Notes  °[place] [facility] [address] °[place], [place] [address] [phone]) [phone] --Must be a resident of [place] °-- Must have NO insurance coverage whatsoever (no Medicaid/ Medicare, etc.) °-- The pt. MUST have a primary care doctor that directs their care regularly and follows them in the community °  °[facility]  (866) 331-1348   °[employer]  (888) 892-1162   ° °Agencies that provide inexpensive medical care: °Organization         Address  Phone   Notes  °[facility]  ([phone]) [phone]   °[facility]    ([phone]) [phone]   °[facility] [address] °[place], [place] [address] [phone]) [phone]   °[facility] [address], °[place] ([phone]) [phone]   °[facility]    ([phone]) [phone]   °[facility]    ([phone]) [phone]   °[facility] ° [address], [place] Phone:  ([phone]) [phone], Fax:  [phone] Hours of Operation:  9 am - 6 pm, M-F.  Also accepts Medicaid/Medicare and self-pay.  °[facility] ° [address], [place] Phone: [phone], Fax: [phone]. Hours of Operation:  8:30 am - 5:30 pm, M-F.  Also accepts Medicaid and self-pay.  °[facility] [place] [address], [place] Phone: ([phone]) [phone]   °[facility] [address], [place], [place] ([phone], Ext. 123 Mondays & Thursdays: 7-9 AM.  First 15 patients are seen on a first come, first serve basis. °  ° °Medicaid-accepting [place] Providers: ° °Organization         Address  Phone   Notes  °[facility] [address], [place] [phone] Also accepts self-pay patients.  °[facility] [address], [place] ° ([phone]) [phone]   °[facility] [address],  ([phone]) [phone]   °[facility] [address] [place] [address],  (  [phone]   Renaye RakersVeita Bland [address], [place]   [phone] Only accepts [place] patients after they have their name applied to their card.   Self-Pay (no insurance) in [facility] [place]:  Organization         Address  Phone   Notes  Sickle Cell Patients, [facility] [address], [place] [phone]   [facility] [address], [place] [phone]   [facility]  [address],  [phone]   [facility]/Dr. Osei-Bonsu  [address] [place] [address], [place] or [phone], [place] [phone] Phone number for both [place] and [place] locations is the same.   [facility] [address], [place] [phone]   [facility] [address] [place] [address], [place] or [address] [phone]   [facility] [address], [place] [phone], phone; [phone], fax Sees patients 1st and 3rd Saturday of every month.  Must not qualify for public or private insurance (i.e. Medicaid, Medicare, [place] Health Choice, Veterans' Benefits)  Household income should be no more than 200% of the poverty level The clinic cannot treat you if you are pregnant or think you are pregnant  Sexually transmitted diseases are not treated at the clinic.

## 2014-07-16 NOTE — ED Notes (Signed)
The pt has multiple symptoms headache neck pain rt arm pain cloudy vision back pain for 3 weeks.  He has been seen for these same symptoms with no diagnosis in the past 3 weeks

## 2014-10-29 ENCOUNTER — Encounter (HOSPITAL_COMMUNITY): Payer: Self-pay | Admitting: Emergency Medicine

## 2014-10-29 ENCOUNTER — Emergency Department (HOSPITAL_COMMUNITY)
Admission: EM | Admit: 2014-10-29 | Discharge: 2014-10-29 | Disposition: A | Discharge status: Home / Self Care | Payer: Worker's Compensation | Attending: Emergency Medicine | Admitting: Emergency Medicine

## 2014-10-29 DIAGNOSIS — Y999 Unspecified external cause status: Secondary | ICD-10-CM | POA: Insufficient documentation

## 2014-10-29 DIAGNOSIS — Y288XXA Contact with other sharp object, undetermined intent, initial encounter: Secondary | ICD-10-CM | POA: Diagnosis not present

## 2014-10-29 DIAGNOSIS — Y939 Activity, unspecified: Secondary | ICD-10-CM | POA: Diagnosis not present

## 2014-10-29 DIAGNOSIS — S6991XA Unspecified injury of right wrist, hand and finger(s), initial encounter: Secondary | ICD-10-CM | POA: Diagnosis not present

## 2014-10-29 DIAGNOSIS — Y929 Unspecified place or not applicable: Secondary | ICD-10-CM | POA: Diagnosis not present

## 2014-10-29 LAB — RAPID HIV SCREEN (WH-MAU): SUDS RAPID HIV SCREEN: NONREACTIVE

## 2014-10-29 NOTE — ED Provider Notes (Signed)
CSN: [id number]     Arrival date & time 10/29/14  [id number] History   None    Chief Complaint  Patient presents with  . finger cut      (Consider location/radiation/quality/duration/timing/severity/associated sxs/prior Treatment) HPI Comments: Patient is a 34 yo M presenting to the ED for evaluation of a cut on his right index finger from a loose staple on the garbage can. He obtained the cut yesterday. He is also concerned because he obtained two small cuts on his right middle finger from the same staple 2 weeks ago. He states the area is sore, but otherwise without symptom. Patient's biggest concern is that multiple people have also injured themselves on the same staple and he may have contracted something. Patient is right hand dominant. Vaccinations UTD for age.     History reviewed. No pertinent past medical history. History reviewed. No pertinent past surgical history. Family History  Problem Relation Age of Onset  . Cancer Mother   . Diabetes Mother   . Hypertension Father   . Hypertension Brother    History  Substance Use Topics  . Smoking status: Never Smoker   . Smokeless tobacco: Never Used  . Alcohol Use: No    Review of Systems  All other systems reviewed and are negative.     Allergies  Review of patient's allergies indicates no known allergies.  Home Medications   Prior to Admission medications   Not on File   There were no vitals taken for this visit. Physical Exam  Constitutional: He is oriented to person, place, and time. He appears well-developed and well-nourished. No distress.  HENT:  Head: Normocephalic and atraumatic.  Right Ear: External ear normal.  Left Ear: External ear normal.  Nose: Nose normal.  Mouth/Throat: Oropharynx is clear and moist.  Eyes: Conjunctivae are normal.  Neck: Normal range of motion. Neck supple.  Cardiovascular: Normal rate, regular rhythm, normal heart sounds and intact distal pulses.   Pulmonary/Chest: Effort normal  and breath sounds normal.  Abdominal: Soft.  Musculoskeletal: Normal range of motion.       Right wrist: Normal.       Left wrist: Normal.       Left hand: Normal.       Hands: Neurological: He is alert and oriented to person, place, and time. He has normal strength. No sensory deficit. GCS eye subscore is 4. GCS verbal subscore is 5. GCS motor subscore is 6.  Skin: Skin is warm and dry. He is not diaphoretic.  Psychiatric: He has a normal mood and affect.  Nursing note and vitals reviewed.   ED Course  Procedures (including critical care time) Medications - No data to display  Labs Review Labs Reviewed - No data to display  Imaging Review No results found.   EKG Interpretation None      MDM   Final diagnoses:  Finger injury, right, initial encounter     NAD, non-toxic appearing, AAOx4.  Neurovascularly intact. Normal sensation. No evidence of compartment syndrome. Vaccinations UTD. Hepatitis and HIV panel sent at patient request. Symptomatic measures discussed. Wounds are healed and do not require laceration repair. No evidence of infection. Pt is hemodynamically stable w no complaints prior to dc. Patient is stable at time of discharge       Francee PiccoloJennifer Lacey Wallman, PA-C 10/29/14 1653  Loren Raceravid Yelverton, MD 10/30/14 [phone]

## 2014-10-29 NOTE — ED Notes (Signed)
Patient states an understanding to follow up with the referred office for follow up per discharge instructions.

## 2014-10-29 NOTE — Discharge Instructions (Signed)
Non-Sutured Laceration  A laceration is a cut or wound that goes through all layers of the skin and into the tissue just beneath the skin. Usually, these are stitched up or held together with tape or glue shortly after the injury occurred. However, if several or more hours have passed before getting care, too many germs (bacteria) get into the laceration. Stitching it closed would bring the risk of infection. If your health care provider feels your laceration is too old, it may be left open and then bandaged to allow healing from the bottom layer up.  HOME CARE INSTRUCTIONS   · Change the bandage (dressing) 2 times a day or as directed by your health care provider.  · If the dressing or packing gauze sticks, soak it off with soapy water.  · When you re-bandage your laceration, make sure that the dressing or packing gauze goes all the way to the bottom of the laceration. The top of the laceration is kept open so it can heal from the bottom up. There is less chance for infection with this method.  · Wash the area with soap and water 2 times a day to remove all the creams or ointments, if used. Rinse off the soap. Pat the area dry with a clean towel. Look for signs of infection, such as redness, swelling, or a red line that goes away from the laceration.  · Re-apply creams or ointments if they were used to bandage the laceration. This helps keep the bandage from sticking.  · If the bandage becomes wet, dirty, or has a bad smell, change it as soon as possible.  · Only take medicine as directed by your health care provider.  You might need a tetanus shot now if:  · You have no idea when you had the last one.  · You have never had a tetanus shot before.  · Your laceration had dirt in it.  · Your laceration was dirty, and your last tetanus shot was more than 7 years ago.  · Your laceration was clean, and your last tetanus shot was more than 10 years ago.  If you need a tetanus shot, and you decide not to get one, there is  a rare chance of getting tetanus. Sickness from tetanus can be serious. If you got a tetanus shot, your arm may swell and get red and warm to the touch at the shot site. This is common and not a problem.  SEEK MEDICAL CARE IF:   · You have redness, swelling, or increasing pain in the laceration.  · You notice a red line that goes away from your laceration.  · You have pus coming from the laceration.  · You have a fever.  · You notice a bad smell coming from the laceration or dressing.  · You notice something coming out of the laceration, such as wood or glass.  · Your laceration is on your hand or foot and you are unable to properly move a finger or toe.  · You have severe swelling around the laceration, causing pain and numbness.  · You notice a change in color in your arm, hand, leg, or foot.  MAKE SURE YOU:   · Understand these instructions.  · Will watch your condition.  · Will get help right away if you are not doing well or get worse.  Document Released: 07/03/2006 Document Revised: 08/10/2013 Document Reviewed: 01/23/2009  ExitCare® Patient Information ©2015 [employer]. This information is not intended to   replace advice given to you by your health care provider. Make sure you discuss any questions you have with your health care provider.

## 2014-10-29 NOTE — ED Notes (Signed)
Patient states he cut his middle finger two weeks ago on a machine at work and that his pointer finger was cut on a staple yesterday, 10/28/2014. He is concerned about the possible risk of someone else also having cut their finger on the same staple.

## 2014-10-31 LAB — HEPATITIS PANEL, ACUTE
HCV AB: NEGATIVE
HEP B S AG: NEGATIVE
Hep A IgM: NONREACTIVE
Hep B C IgM: NONREACTIVE

## 2014-11-03 ENCOUNTER — Ambulatory Visit (INDEPENDENT_AMBULATORY_CARE_PROVIDER_SITE_OTHER): Payer: Worker's Compensation | Admitting: Family Medicine

## 2014-11-03 ENCOUNTER — Ambulatory Visit: Payer: Self-pay

## 2014-11-03 VITALS — BP 114/66 | HR 72 | Temp 98.0°F | Resp 16 | Ht 68.5 in | Wt 148.0 lb

## 2014-11-03 DIAGNOSIS — D649 Anemia, unspecified: Secondary | ICD-10-CM

## 2014-11-03 DIAGNOSIS — R109 Unspecified abdominal pain: Secondary | ICD-10-CM

## 2014-11-03 DIAGNOSIS — R5383 Other fatigue: Secondary | ICD-10-CM

## 2014-11-03 DIAGNOSIS — S61219A Laceration without foreign body of unspecified finger without damage to nail, initial encounter: Secondary | ICD-10-CM

## 2014-11-03 DIAGNOSIS — G4489 Other headache syndrome: Secondary | ICD-10-CM

## 2014-11-03 DIAGNOSIS — R195 Other fecal abnormalities: Secondary | ICD-10-CM

## 2014-11-03 DIAGNOSIS — E860 Dehydration: Secondary | ICD-10-CM | POA: Diagnosis not present

## 2014-11-03 DIAGNOSIS — S61200A Unspecified open wound of right index finger without damage to nail, initial encounter: Secondary | ICD-10-CM | POA: Diagnosis not present

## 2014-11-03 DIAGNOSIS — S61209A Unspecified open wound of unspecified finger without damage to nail, initial encounter: Secondary | ICD-10-CM

## 2014-11-03 LAB — POCT URINALYSIS DIPSTICK
Bilirubin, UA: NEGATIVE
Blood, UA: NEGATIVE
Glucose, UA: NEGATIVE
Leukocytes, UA: NEGATIVE
Nitrite, UA: NEGATIVE
Spec Grav, UA: >=1.03
Urobilinogen, UA: 0.2
pH, UA: 6

## 2014-11-03 LAB — POCT CBC
Granulocyte percent: 60 % (ref 37–80)
HCT, POC: 45.7 % (ref 43.5–53.7)
Hemoglobin: 13.3 g/dL — AB (ref 14.1–18.1)
Lymph, poc: 2.4 (ref 0.6–3.4)
MCH, POC: 22.2 pg — AB (ref 27–31.2)
MCHC: 29.2 g/dL — AB (ref 31.8–35.4)
MCV: 75.9 fL — AB (ref 80–97)
MID (cbc): 0.4 (ref 0–0.9)
MPV: 7.7 fL (ref 0–99.8)
POC Granulocyte: 4.2 (ref 2–6.9)
POC LYMPH PERCENT: 34 %L (ref 10–50)
POC MID %: 6 %M (ref 0–12)
Platelet Count, POC: 203 10*3/uL (ref 142–424)
RBC: 6.01 M/uL (ref 4.69–6.13)
RDW, POC: 16.6 %
WBC: 7 10*3/uL (ref 4.6–10.2)

## 2014-11-03 LAB — POCT UA - MICROSCOPIC ONLY
Casts, Ur, LPF, POC: NEGATIVE
Crystals, Ur, HPF, POC: NEGATIVE
Yeast, UA: NEGATIVE

## 2014-11-03 NOTE — Progress Notes (Signed)
 Chief Complaint:  Chief Complaint  Patient presents with  . Hand Pain    Right index and middle finger, X Friday, scraped fingers on rusty staple, afraid of contagious disease    HPI: Luis Short is a 34 y.o. male who is here for generalized fatigue, nausea, stool changes, headache after he had touched as staple near the trash can and cut his fingers. He was seen in the ER on March 12 and testing was done for HIV , hepA,  C have a an hep B which are all negative. He is here today because since his lacerations of the finger from the staples he acquired during work has made him feeling not himself, he states he feels like he is been sick since the cut on his fingers.  He works at [employer] and the staple was out in [employer] , he thinks that more people cut their hand on the staples but nobody has come forth except for him. Again HIV, hepatitis testing were negative. He is worried that he might have an infectious disease from exposure to the staples with other people having their fingers being caught on it as well. Associated symptoms include: All over body numbness, He has diffuse headache a couple of hours after he touched the staples. He went to work and was trying to get water but his manager sent the patient home because of his HA, he took tylenol and went to sleep. Then he started to feel "hurt all over", he states He just doesn't feel right He has had nause but no vomitus, He is constipated, He feels bad and doe snot know why. He has had light yellow, bright color stools, he denies any blood in his stool. He has eating cookout and spaghetti  He feels constipated, no diarrhea. 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   He has a cousin and  niece who both has sickle cell anemia, he denies having sickle cell anemia himself or is a or has a sickle cell trait.   Below is the office visit from the ER  HPI Comments: Patient is a 34 yo M presenting to the ED for evaluation of a cut on his right index finger from a loose staple on the garbage can. He obtained the cut yesterday. He is also concerned because he obtained two small cuts on his right middle finger from the same staple 2 weeks ago. He states the area is sore, but otherwise without symptom. Patient's biggest concern is that multiple people have also injured themselves on the same staple and he may have contracted something. Patient is right hand dominant. Vaccinations UTD for age.   History reviewed. No pertinent past medical history. History reviewed. No pertinent past surgical history. History   Social History  . Marital Status: Single    Spouse Name: N/A  . Number of Children: N/A  . Years of Education: N/A   Social History Main Topics  . Smoking status: Never Smoker   . Smokeless tobacco: Never Used  . Alcohol Use: No  . Drug Use: No  . Sexual Activity: Not on file   Other Topics Concern  . None   Social History Narrative  Family History  Problem Relation Age of Onset  . Cancer Mother   . Diabetes Mother   . Hypertension Father   . Hypertension Brother    No Known Allergies Prior to Admission medications   Not on File     ROS: The patient denies fevers, chills, night sweats, unintentional weight loss, chest pain, palpitations, wheezing, dyspnea on exertion, nausea, vomiting, abdominal pain, dysuria, hematuria, melena, numbness, weakness, or tingling.  All other systems have been reviewed and were otherwise negative with the exception of those mentioned in the HPI and as above.    PHYSICAL EXAM: Filed Vitals:   11/03/14 1542  BP: 114/66  Pulse: 72  Temp: 98 F (36.7 C)  Resp: 16   Filed Vitals:   11/03/14 1542  Height: 5' 8.5" (1.74 m)    Weight: 148 lb (67.132 kg)   Body mass index is 22.17 kg/(m^2).  General: Alert, no acute distress HEENT:  Normocephalic, atraumatic, oropharynx patent. EOMI, PERRLA Cardiovascular:  Regular rate and rhythm, no rubs murmurs or gallops.  No Carotid bruits, radial pulse intact. No pedal edema.  Respiratory: Clear to auscultation bilaterally.  No wheezes, rales, or rhonchi.  No cyanosis, no use of accessory musculature GI: No organomegaly, abdomen is soft and non-tender, positive bowel sounds.  No masses. Skin: No rashes. Positive lacerations on his fingers are healing well. No evidence of infection. They are closed and roughly 1/4 inch in length. Neurologic: Facial musculature symmetric. Psychiatric: Patient is appropriate throughout our interaction. Lymphatic: No cervical lymphadenopathy Musculoskeletal: Gait intact. 5 out of 5 prescription strength, sensation intact. Radial pulses intact. Range of motion in his hands wrist and fingers  LABS: Results for orders placed or performed in visit on 11/03/14  POCT CBC  Result Value Ref Range   WBC 7.0 4.6 - 10.2 K/uL   Lymph, poc 2.4 0.6 - 3.4   POC LYMPH PERCENT 34.0 10 - 50 %L   MID (cbc) 0.4 0 - 0.9   POC MID % 6.0 0 - 12 %M   POC Granulocyte 4.2 2 - 6.9   Granulocyte percent 60.0 37 - 80 %G   RBC 6.01 4.69 - 6.13 M/uL   Hemoglobin 13.3 (A) 14.1 - 18.1 g/dL   HCT, POC 16.1 09.6 - 53.7 %   MCV 75.9 (A) 80 - 97 fL   MCH, POC 22.2 (A) 27 - 31.2 pg   MCHC 29.2 (A) 31.8 - 35.4 g/dL   RDW, POC 04.5 %   Platelet Count, POC 203 142 - 424 K/uL   MPV 7.7 0 - 99.8 fL  POCT UA - Microscopic Only  Result Value Ref Range   WBC, Ur, HPF, POC 0-2    RBC, urine, microscopic 0-1    Bacteria, U Microscopic trace    Mucus, UA 1+    Epithelial cells, urine per micros 0-2    Crystals, Ur, HPF, POC neg    Casts, Ur, LPF, POC neg    Yeast, UA neg   POCT urinalysis dipstick  Result Value Ref Range   Color, UA yellow    Clarity, UA clear     Glucose, UA neg    Bilirubin, UA neg    Ketones, UA trace    Spec Grav, UA >=1.030    Blood, UA neg    pH, UA 6.0    Protein, UA tr    Urobilinogen, UA 0.2    Nitrite, UA neg    Leukocytes, UA Negative  EKG/XRAY:   [facility]. No obstruction or free air    ASSESSMENT/PLAN: Encounter Diagnoses  Name Primary?  . Other fatigue   . Cut of finger Yes  . Abnormal stool color   . Abdominal pain, unspecified abdominal location   . Other headache syndrome   . Dehydration   . Anemia, unspecified anemia type    Luis Short is a 34 year old African-American male who is here for 2 cuts on his finger from a staple that was around the recycling bin at work. He was evaluated initially in the ER and is now here because he has persistent symptoms of fatigue, headache, abnormal stools, abdominal pain on the right upper and lower side after being cut from the staples. Again he stated that all of his symptoms started after he got the cuts on his finger. Advised to push fluids at home since he looks dehydrated based on ketones in his urine, also this may help with his headache. He did show me a picture of abnormal colored stools. We will go ahead and get a CMP to see if he has any abnormal liver enzymes or bilirubin. The cut on his fingers are healing nicely. The patient is up-to-date on his tetanus. Drug tox screen was done in the ER. He had HIV, hepatitis A-B-C labs done at the ER on initial visit and they were all negative.  He has a slight anemia of unknown etiology, he needs to have that worked up with his primary care doctor. Labs pending He will follow-up on Sunday, he can return to work on light duty I recommend that he gets repeat HIV, hepatitis testing in 6 months from now.  Gross sideeffects, risk and benefits, and alternatives of medications d/w patient. Patient is aware that all medications have potential sideeffects and we are unable to  predict every sideeffect or drug-drug interaction that may occur.  ,  PHUONG, DO 11/03/2014 6:02 PM

## 2014-11-04 LAB — COMPLETE METABOLIC PANEL WITH GFR
ALT: 18 U/L (ref 0–53)
AST: 22 U/L (ref 0–37)
Creat: 0.9 mg/dL (ref 0.50–1.35)
GFR, Est African American: >89 mL/min
Potassium: 4.6 mEq/L (ref 3.5–5.3)
Sodium: 142 mEq/L (ref 135–145)
Total Bilirubin: 0.5 mg/dL (ref 0.2–1.2)

## 2014-11-04 LAB — COMPLETE METABOLIC PANEL WITHOUT GFR
Albumin: 4.4 g/dL (ref 3.5–5.2)
Alkaline Phosphatase: 79 U/L (ref 39–117)
BUN: 11 mg/dL (ref 6–23)
CO2: 27 meq/L (ref 19–32)
Calcium: 9.4 mg/dL (ref 8.4–10.5)
Chloride: 105 meq/L (ref 96–112)
GFR, Est Non African American: >89 mL/min
Glucose, Bld: 84 mg/dL (ref 70–99)
Total Protein: 7 g/dL (ref 6.0–8.3)

## 2014-11-06 ENCOUNTER — Ambulatory Visit (INDEPENDENT_AMBULATORY_CARE_PROVIDER_SITE_OTHER): Payer: Worker's Compensation | Admitting: Emergency Medicine

## 2014-11-06 VITALS — BP 118/72 | HR 80 | Temp 98.4°F | Resp 14 | Ht 68.5 in | Wt 146.8 lb

## 2014-11-06 DIAGNOSIS — S61200D Unspecified open wound of right index finger without damage to nail, subsequent encounter: Secondary | ICD-10-CM | POA: Diagnosis not present

## 2014-11-06 DIAGNOSIS — S61209A Unspecified open wound of unspecified finger without damage to nail, initial encounter: Secondary | ICD-10-CM

## 2014-11-06 DIAGNOSIS — Z578 Occupational exposure to other risk factors: Secondary | ICD-10-CM

## 2014-11-06 DIAGNOSIS — S61219A Laceration without foreign body of unspecified finger without damage to nail, initial encounter: Secondary | ICD-10-CM

## 2014-11-06 NOTE — Progress Notes (Signed)
[facility] [address], [place] [id number] [phone] [phone]  Date:  11/06/2014   Name:  Luis Short   DOB:  01/16/1981   MRN:  [id number]  PCP:  No PCP Per Patient    Chief Complaint: Follow-up   History of Present Illness:  Luis ShutterMilton M Short is a 34 y.o. very pleasant male patient who presents with the following:  [facility] and treated for a laceration of the right hand. Is concerned that he may have been exposed to someone's blood from the staples in the trash He was subsequently seen here and had negative labs [facility] here [facility]. He is concerned that his stools are yellow (or white) and has fatigue and a headache He is concerned he may have a "disease" from the trash. Denies other complaint or health concern today.   There are no active problems to display for this patient.   History reviewed. No pertinent past medical history.  History reviewed. No pertinent past surgical history.  History  Substance Use Topics  . Smoking status: Never Smoker   . Smokeless tobacco: Never Used  . Alcohol Use: No    Family History  Problem Relation Age of Onset  . Cancer Mother   . Diabetes Mother   . Hypertension Father   . Hypertension Brother     No Known Allergies  Medication list has been reviewed and updated.  No current outpatient prescriptions on file prior to visit.   No current facility-administered medications on file prior to visit.    Review of Systems:  As per HPI, otherwise negative.    Physical Examination: Filed Vitals:   11/06/14 1618  BP: 118/72  Pulse: 80  Temp: 98.4 F (36.9 C)  Resp: 14   Filed Vitals:   11/06/14 1618  Height: 5' 8.5" (1.74 m)  Weight: 146 lb 12.8 oz (66.588 kg)   Body mass index is 21.99 kg/(m^2). Ideal Body Weight: Weight in (lb) to have BMI = 25: 166.5  GEN: WDWN, NAD, Non-toxic, A & O x 3 HEENT: Atraumatic, Normocephalic. Neck supple. No masses, No LAD. Ears  and Nose: No external deformity. CV: RRR, No M/G/R. No JVD. No thrill. No extra heart sounds. PULM: CTA B, no wheezes, crackles, rhonchi. No retractions. No resp. distress. No accessory muscle use. ABD: S, NT, ND, +BS. No rebound. No HSM. EXTR: No c/c/e NEURO Normal gait.  PSYCH: Normally interactive. Conversant. Not depressed or anxious appearing.  Calm demeanor.    Assessment and Plan: Exposure to body fluids potentially Hep c AB Follow up in 3 weeks  Signed,  Phillips OdorJeffery Constanza Mincy, MD

## 2014-11-06 NOTE — Patient Instructions (Signed)
Body Fluid Exposure Information °People may come into contact with blood and other body fluids under various circumstances. In some cases, body fluids may contain germs (bacteria or viruses) that cause infections. These germs can be spread when another person's body fluids come into contact with your skin, mouth, eyes, or genitals.  °Exposure to body fluids that may contain infectious material is a common problem for people providing care for others who are ill. It can occur when a person is performing health care tasks in the workplace or when taking care of a family member at home. Other common methods of exposure include injection drug use, sharing needles, and sexual activity. °The risk of an infection spreading through body fluid exposure is small and depends on a variety of factors. This includes the type of body fluid, the nature of the exposure, and the health status of the person who was the source of the body fluids. Your health care provider can help you assess the risk. °WHAT TYPES OF BODY FLUID CAN SPREAD INFECTION? °The following types of body fluid have the potential to spread infections: °· Blood. °· Semen. °· Vaginal secretions. °· Urine. °· Feces. °· Saliva. °· Nasal or eye discharge. °· Breast milk. °· Amniotic fluid and fluids surrounding body organs. °WHAT ARE SOME FIRST-AID MEASURES FOR BODY FLUID EXPOSURE? °The following steps should be taken as soon as possible after a person is exposed to body fluids: °Intact Skin °· For contact with closed skin, wash the area with soap and water. °Broken Skin °· For contact with broken skin (a wound), wash the area with soap and water. Let the area bleed a Cheeks. Then place a bandage or clean towel on the wound, applying gentle pressure to stop the bleeding. Do not squeeze or rub the area. °· Use just water or hand sanitizer if a sink with soap is not available. °· Do not use harsh chemicals such as bleach or iodine. °Eyes °· Rinse the eyes with water or  saline for 30 seconds. °· If the person is wearing contact lenses, leave the contact lenses in while rinsing the eyes. Once the rinsing is complete, remove the contact lenses. °Mouth °· Spit out the fluids. Rinse and spit with water 4-5 times. °In addition, you should remove any clothing that comes into contact with body fluids. However, if body fluid exposure results from sexual assault, seek medical care immediately without changing clothes or bathing. °WHEN SHOULD YOU SEEK HELP? °After performing the proper first-aid steps, you should contact your health care provider or seek emergency care right away if blood or other body fluids made contact with areas of broken skin or openings such as the eyes or mouth. If the exposure to body fluid happened in the workplace, you should report it to your work supervisor immediately. Many workplaces have procedures in place for exposure situations. °WHAT WILL HAPPEN AFTER YOU REPORT THE EXPOSURE? °Your health care provider will ask you several questions. Information requested may include: °· Your medical history, including vaccination records. °· Date and time of the exposure. °· Whether you saw body fluids during the exposure. °· Type of body fluid you were exposed to. °· Volume of body fluid you were exposed to. °· How the exposure happened. °· If any devices, such as a needle, were being used. °· Which area of your body made contact with the body fluid. °· Description of any injury to the skin or other area. °· How long contact was made with the body   fluid. °· Any information you have about the health status of the person whose body fluid you were exposed to. °The health care provider will assess your risk of infection. Often, no treatment is necessary. In some cases, the health care provider may recommend doing blood tests right away. Follow-up blood tests may also be done at certain intervals during the upcoming weeks and months to check for changes. You may be offered  treatment to prevent an infection from developing after exposure (post-exposure prophylaxis). This may include certain vaccinations or medicines and may be necessary when there is a risk of a serious infection, such as HIV or hepatitis B. Your health care provider should discuss appropriate treatment and vaccinations with you. °HOW CAN YOU PREVENT EXPOSURE AND INFECTION? °Always remember that prevention is the first line of defense against body fluid exposure. To help prevent exposure to body fluids: °· Wash and disinfect countertops and other surfaces regularly. °· Wear appropriate protective gear such as gloves, gowns, or eyewear when the possibility of exposure is present. °· Wipe away spills of body fluid with disposable towels. °· Properly dispose of blood products and other fluids. Use secured bags. °· Properly dispose of needles and other instruments with sharp points or edges (sharps). Use closed, marked containers. °· Avoid injection drug use. °· Do not share needles. °· Avoid recapping needles. °· Use a condom during sexual intercourse. °· Make sure you learn and follow any guidelines for preventing exposure (universal precautions) provided at your workplace. °To help reduce your chances of getting an infection: °· Make sure your vaccinations are up-to-date, including those for tetanus and hepatitis. °· Wash your hands frequently with soap and water. Use hand sanitizers. °· Avoid having multiple sex partners. °· Follow up with your health care provider as directed after being evaluated for an exposure to body fluids. °To avoid spreading infection to others: °· Do not have sexual relations until you know you are free of infection. °· Do not donate blood, plasma, breast milk, sperm, or other body fluids. °· Do not share hygiene tools such as toothbrushes, razors, or dental floss. °· Keep open wounds covered. °· Dispose of any items with blood on them (razors, tampons, bandages) by putting them in the  trash. °· Do not share drug supplies with others, such as needles, syringes, straws, or pipes. °· Follow all of your health care provider's instructions for preventing the spread of infection. °Document Released: 04/07/2013 Document Revised: 08/10/2013 Document Reviewed: 04/07/2013 °ExitCare® Patient Information ©2015 [employer]. This information is not intended to replace advice given to you by your health care provider. Make sure you discuss any questions you have with your health care provider. ° °

## 2014-11-07 LAB — COMPREHENSIVE METABOLIC PANEL
ALK PHOS: 81 U/L (ref 39–117)
ALT: 13 U/L (ref 0–53)
AST: 16 U/L (ref 0–37)
Albumin: 4.5 g/dL (ref 3.5–5.2)
BUN: 12 mg/dL (ref 6–23)
CALCIUM: 9.7 mg/dL (ref 8.4–10.5)
CHLORIDE: 103 meq/L (ref 96–112)
CO2: 29 mEq/L (ref 19–32)
Creat: 0.95 mg/dL (ref 0.50–1.35)
Glucose, Bld: 85 mg/dL (ref 70–99)
POTASSIUM: 4.3 meq/L (ref 3.5–5.3)
Sodium: 140 mEq/L (ref 135–145)
Total Bilirubin: 0.7 mg/dL (ref 0.2–1.2)
Total Protein: 7.2 g/dL (ref 6.0–8.3)

## 2014-11-07 LAB — HEPATITIS C ANTIBODY: HCV Ab: NEGATIVE

## 2014-11-19 ENCOUNTER — Encounter: Payer: Self-pay | Admitting: Family Medicine

## 2014-12-14 ENCOUNTER — Ambulatory Visit (INDEPENDENT_AMBULATORY_CARE_PROVIDER_SITE_OTHER): Payer: Worker's Compensation | Admitting: Family Medicine

## 2014-12-14 VITALS — BP 118/58 | HR 87 | Temp 98.3°F | Resp 16 | Ht 68.0 in | Wt 151.2 lb

## 2014-12-14 DIAGNOSIS — S61202D Unspecified open wound of right middle finger without damage to nail, subsequent encounter: Secondary | ICD-10-CM

## 2014-12-14 DIAGNOSIS — S61209A Unspecified open wound of unspecified finger without damage to nail, initial encounter: Secondary | ICD-10-CM

## 2014-12-14 DIAGNOSIS — S61200D Unspecified open wound of right index finger without damage to nail, subsequent encounter: Secondary | ICD-10-CM

## 2014-12-14 DIAGNOSIS — S61219A Laceration without foreign body of unspecified finger without damage to nail, initial encounter: Secondary | ICD-10-CM

## 2014-12-14 NOTE — Progress Notes (Signed)
 Chief Complaint:  Chief Complaint  Patient presents with  . Follow-up    Work [employer].     HPI: Luis Short is a 34 y.o. male who is here for recheck of his right index and middle finger after he injured it with a staple at work. He is doing well. There is no fevers or chills. He is full function of his hands. There is no numbness or tingling. He has full range of motion of his fingers and hand. It is back to normal function. Please see note from my last office visit below  Luis Short is a 34 y.o. male who is here for generalized fatigue, nausea, stool changes, headache after he had touched as staple near the trash can and cut his fingers. He was seen in the ER on March 12 and testing was done for HIV , hepA, C have a an hep B which are all negative. He is here today because since his lacerations of the finger from the staples he acquired during work has made him feeling not himself, he states he feels like he is been sick since the cut on his fingers.  He works at [employer] and the staple was out in [employer] , he thinks that more people cut their hand on the staples but nobody has come forth except for him. Again HIV, hepatitis testing were negative. He is worried that he might have an infectious disease from exposure to the staples with other people having their fingers being caught on it as well. Associated symptoms include: All over body numbness, He has diffuse headache a couple of hours after he touched the staples. He went to work and was trying to get water but his manager sent the patient home because of his HA, he took tylenol and went to sleep. Then he started to feel "hurt all over", he states He just doesn't feel right He has had nause but no vomitus, He is constipated, He feels bad and doe snot know why. He has had light yellow, bright color stools, he denies any blood in his stool. He has eating cookout and spaghetti  He feels constipated, no  diarrhea. 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   He has a cousin and niece who both has sickle cell anemia, he denies having sickle cell anemia himself or is a or has a sickle cell trait.   Below is the office visit from the ER  HPI Comments: Patient is a 34 yo M presenting to the ED for evaluation of a cut on his right index finger from a loose staple on the garbage can. He obtained the cut yesterday. He is also concerned because he obtained two small cuts on his right middle finger from the same staple 2 weeks ago. He states the area is sore, but otherwise without symptom. Patient's biggest concern is that multiple people have also injured themselves on the same staple and he may have contracted something. Patient is right hand dominant. Vaccinations UTD for age.   History reviewed. No pertinent past medical history. History reviewed. No pertinent past surgical history. History   Social History  . Marital Status: Single    Spouse  Name: N/A  . Number of Children: N/A  . Years of Education: N/A   Social History Main Topics  . Smoking status: Never Smoker   . Smokeless tobacco: Never Used  . Alcohol Use: No  . Drug Use: No  . Sexual Activity: Not on file   Other Topics Concern  . None   Social History Narrative   Family History  Problem Relation Age of Onset  . Cancer Mother   . Diabetes Mother   . Hypertension Father   . Hypertension Brother    No Known Allergies Prior to Admission medications   Medication Sig Start Date End Date Taking? Authorizing Provider  acetaminophen (TYLENOL) 325 MG tablet Take 650 mg by mouth every 6 (six) hours as needed.   Yes Historical Provider, MD     ROS: The patient denies fevers, chills, night sweats,  unintentional weight loss, chest pain, palpitations, wheezing, dyspnea on exertion, nausea, vomiting, abdominal pain, dysuria, hematuria, melena, numbness, weakness, or tingling.   All other systems have been reviewed and were otherwise negative with the exception of those mentioned in the HPI and as above.    PHYSICAL EXAM: Filed Vitals:   12/14/14 1738  BP: 118/58  Pulse: 87  Temp: 98.3 F (36.8 C)  Resp: 16   Filed Vitals:   12/14/14 1738  Height:  (1.727 m)  Weight: 151 lb 3.2 oz (68.584 kg)   Body mass index is 23 kg/(m^2).  General: Alert, no acute distress HEENT:  Normocephalic, atraumatic, oropharynx patent. EOMI, PERRLA Cardiovascular:  Regular rate and rhythm, no rubs murmurs or gallops.  No pedal edema.  Respiratory: Clear to auscultation bilaterally.  No wheezes, rales, or rhonchi.  No cyanosis, no use of accessory musculature GI: No organomegaly, abdomen is soft and non-tender, positive bowel sounds.  No masses. Skin: No rashes. Neurologic: Facial musculature symmetric. Psychiatric: Patient is appropriate throughout our interaction. Lymphatic: No cervical lymphadenopathy Musculoskeletal: Gait intact. Right hand and finger show no deformities. Normal range of motion, sensation intact, radial pulses intact, 5 out of 5 strength.   LABS: Results for orders placed or performed in visit on 11/06/14  Comprehensive metabolic panel  Result Value Ref Range   Sodium 140 135 - 145 mEq/L   Potassium 4.3 3.5 - 5.3 mEq/L   Chloride 103 96 - 112 mEq/L   CO2 29 19 - 32 mEq/L   Glucose, Bld 85 70 - 99 mg/dL   BUN 12 6 - 23 mg/dL   Creat 1.61 0.96 - 0.45 mg/dL   Total Bilirubin 0.7 0.2 - 1.2 mg/dL   Alkaline Phosphatase 81 39 - 117 U/L   AST 16 0 - 37 U/L   ALT 13 0 - 53 U/L   Total Protein 7.2 6.0 - 8.3 g/dL   Albumin 4.5 3.5 - 5.2 g/dL   Calcium 9.7 8.4 - 40.9 mg/dL  Hepatitis C antibody  Result Value Ref Range   HCV Ab NEGATIVE NEGATIVE     EKG/XRAY:     Primary read interpreted by Dr. Conley Rolls at [facility].   ASSESSMENT/PLAN: Encounter Diagnosis  Name Primary?  . Cut of finger Yes   Normal hand and finger . No restrictions for work He can go ahead and follow-up as needed.  He will return in 3-6 months from the date of injury to his finger which was 10/29/2014 for recheck of his HIV status after being stuck with a staple in the trash can. Note given  Gross sideeffects, risk and benefits,  and alternatives of medications d/w patient. Patient is aware that all medications have potential sideeffects and we are unable to predict every sideeffect or drug-drug interaction that may occur.  Hamilton CapriLE,  PHUONG, DO 12/14/2014 6:08 PM

## 2015-01-27 ENCOUNTER — Emergency Department (HOSPITAL_COMMUNITY)
Admission: EM | Admit: 2015-01-27 | Discharge: 2015-01-27 | Disposition: A | Discharge status: Home / Self Care | Payer: No Typology Code available for payment source | Attending: Emergency Medicine | Admitting: Emergency Medicine

## 2015-01-27 ENCOUNTER — Encounter (HOSPITAL_COMMUNITY): Payer: Self-pay | Admitting: Emergency Medicine

## 2015-01-27 ENCOUNTER — Emergency Department (HOSPITAL_COMMUNITY): Payer: No Typology Code available for payment source

## 2015-01-27 DIAGNOSIS — R200 Anesthesia of skin: Secondary | ICD-10-CM | POA: Diagnosis not present

## 2015-01-27 DIAGNOSIS — R1031 Right lower quadrant pain: Secondary | ICD-10-CM | POA: Diagnosis not present

## 2015-01-27 LAB — CBC WITH DIFFERENTIAL/PLATELET
BASOS ABS: 0 10*3/uL (ref 0.0–0.1)
Basophils Relative: 0 % (ref 0–1)
EOS PCT: 2 % (ref 0–5)
Eosinophils Absolute: 0.2 10*3/uL (ref 0.0–0.7)
HEMATOCRIT: 38.6 % — AB (ref 39.0–52.0)
Hemoglobin: 12.7 g/dL — ABNORMAL LOW (ref 13.0–17.0)
Lymphocytes Relative: 29 % (ref 12–46)
Lymphs Abs: 2.5 10*3/uL (ref 0.7–4.0)
MCH: 23.8 pg — ABNORMAL LOW (ref 26.0–34.0)
MCHC: 32.9 g/dL (ref 30.0–36.0)
MCV: 72.3 fL — ABNORMAL LOW (ref 78.0–100.0)
MONOS PCT: 7 % (ref 3–12)
Monocytes Absolute: 0.6 10*3/uL (ref 0.1–1.0)
NEUTROS PCT: 61 % (ref 43–77)
Neutro Abs: 5.2 10*3/uL (ref 1.7–7.7)
Platelets: 214 10*3/uL (ref 150–400)
RBC: 5.34 MIL/uL (ref 4.22–5.81)
RDW: 14.1 % (ref 11.5–15.5)
WBC: 8.5 10*3/uL (ref 4.0–10.5)

## 2015-01-27 LAB — COMPREHENSIVE METABOLIC PANEL
ALBUMIN: 3.9 g/dL (ref 3.5–5.0)
ALT: 19 U/L (ref 17–63)
ANION GAP: 9 (ref 5–15)
AST: 24 U/L (ref 15–41)
Alkaline Phosphatase: 65 U/L (ref 38–126)
BILIRUBIN TOTAL: 0.5 mg/dL (ref 0.3–1.2)
BUN: 10 mg/dL (ref 6–20)
CHLORIDE: 102 mmol/L (ref 101–111)
CO2: 27 mmol/L (ref 22–32)
Calcium: 8.9 mg/dL (ref 8.9–10.3)
Creatinine, Ser: 0.97 mg/dL (ref 0.61–1.24)
GFR calc Af Amer: >60 mL/min (ref >60)
GFR calc non Af Amer: >60 mL/min (ref >60)
Glucose, Bld: 92 mg/dL (ref 65–99)
Potassium: 3.3 mmol/L — ABNORMAL LOW (ref 3.5–5.1)
Sodium: 138 mmol/L (ref 135–145)
TOTAL PROTEIN: 6.4 g/dL — AB (ref 6.5–8.1)

## 2015-01-27 LAB — URINALYSIS, ROUTINE W REFLEX MICROSCOPIC
Bilirubin Urine: NEGATIVE
Glucose, UA: NEGATIVE mg/dL
Hgb urine dipstick: NEGATIVE
Ketones, ur: NEGATIVE mg/dL
Leukocytes, UA: NEGATIVE
Nitrite: NEGATIVE
PH: 6 (ref 5.0–8.0)
PROTEIN: NEGATIVE mg/dL
Specific Gravity, Urine: 1.022 (ref 1.005–1.030)
UROBILINOGEN UA: 0.2 mg/dL (ref 0.0–1.0)

## 2015-01-27 LAB — LIPASE, BLOOD: Lipase: 19 U/L — ABNORMAL LOW (ref 22–51)

## 2015-01-27 MED ORDER — IOHEXOL 300 MG/ML  SOLN
25.0000 mL | Freq: Once | INTRAMUSCULAR | Status: AC | PRN
  Administered 2015-01-27: 25 mL via ORAL

## 2015-01-27 MED ORDER — KETOROLAC TROMETHAMINE 60 MG/2ML IM SOLN
30.0000 mg | Freq: Once | INTRAMUSCULAR | Status: AC
  Administered 2015-01-27: 30 mg via INTRAMUSCULAR

## 2015-01-27 MED ORDER — IOHEXOL 300 MG/ML  SOLN
100.0000 mL | Freq: Once | INTRAMUSCULAR | Status: AC | PRN
  Administered 2015-01-27: 100 mL via INTRAVENOUS

## 2015-01-27 NOTE — Discharge Instructions (Signed)
Flank Pain  Flank pain refers to pain that is located on the side of the body between the upper abdomen and the back. The pain may occur over a short period of time (acute) or may be long-term or reoccurring (chronic). It may be mild or severe. Flank pain can be caused by many things.  CAUSES   Some of the more common causes of flank pain include:   Muscle strains.    Muscle spasms.    A disease of your spine (vertebral disk disease).    A lung infection (pneumonia).    Fluid around your lungs (pulmonary edema).    A kidney infection.    Kidney stones.    A very painful skin rash caused by the chickenpox virus (shingles).    Gallbladder disease.   HOME CARE INSTRUCTIONS   Home care will depend on the cause of your pain. In general,   Rest as directed by your caregiver.   Drink enough fluids to keep your urine clear or pale yellow.   Only take over-the-counter or prescription medicines as directed by your caregiver. Some medicines may help relieve the pain.   Tell your caregiver about any changes in your pain.   Follow up with your caregiver as directed.  SEEK IMMEDIATE MEDICAL CARE IF:    Your pain is not controlled with medicine.    You have new or worsening symptoms.   Your pain increases.    You have abdominal pain.    You have shortness of breath.    You have persistent nausea or vomiting.    You have swelling in your abdomen.    You feel faint or pass out.    You have blood in your urine.   You have a fever or persistent symptoms for more than 2-3 days.   You have a fever and your symptoms suddenly get worse.  MAKE SURE YOU:    Understand these instructions.   Will watch your condition.   Will get help right away if you are not doing well or get worse.  Document Released: 09/26/2005 Document Revised: 04/29/2012 Document Reviewed: 03/19/2012  ExitCare Patient Information 2015 [employer]. This information is not intended to replace advice given to you by your  health care provider. Make sure you discuss any questions you have with your health care provider.  Abdominal Pain  Many things can cause abdominal pain. Usually, abdominal pain is not caused by a disease and will improve without treatment. It can often be observed and treated at home. Your health care provider will do a physical exam and possibly order blood tests and X-rays to help determine the seriousness of your pain. However, in many cases, more time must pass before a clear cause of the pain can be found. Before that point, your health care provider may not know if you need more testing or further treatment.  HOME CARE INSTRUCTIONS   Monitor your abdominal pain for any changes. The following actions may help to alleviate any discomfort you are experiencing:   Only take over-the-counter or prescription medicines as directed by your health care provider.   Do not take laxatives unless directed to do so by your health care provider.   Try a clear liquid diet (broth, tea, or water) as directed by your health care provider. Slowly move to a bland diet as tolerated.  SEEK MEDICAL CARE IF:   You have unexplained abdominal pain.   You have abdominal pain associated with nausea or diarrhea.     You have pain when you urinate or have a bowel movement.   You experience abdominal pain that wakes you in the night.   You have abdominal pain that is worsened or improved by eating food.   You have abdominal pain that is worsened with eating fatty foods.   You have a fever.  SEEK IMMEDIATE MEDICAL CARE IF:    Your pain does not go away within 2 hours.   You keep throwing up (vomiting).   Your pain is felt only in portions of the abdomen, such as the right side or the left lower portion of the abdomen.   You pass bloody or black tarry stools.  MAKE SURE YOU:   Understand these instructions.    Will watch your condition.    Will get help right away if you are not doing well or get worse.   Document Released:  05/15/2005 Document Revised: 08/10/2013 Document Reviewed: 04/14/2013  [employer] Patient Information 2015 [employer]. This information is not intended to replace advice given to you by your health care provider. Make sure you discuss any questions you have with your health care provider.

## 2015-01-27 NOTE — ED Notes (Signed)
Patient is resting comfortably. 

## 2015-01-27 NOTE — ED Provider Notes (Signed)
CSN: [id number]     Arrival date & time 01/27/15  0134 History   First MD Initiated Contact with Patient 01/27/15 0148     This chart was scribed for Mirian Mo, MD by Arlan Organ, ED Scribe. This patient was seen in room D31C/D31C and the patient's care was started 3:10 AM.   Chief Complaint  Patient presents with  . Abdominal Pain   Patient is a 34 y.o. male presenting with abdominal pain. The history is provided by the patient. No language interpreter was used.  Abdominal Pain Pain location:  RLQ Pain radiates to:  Does not radiate Pain severity:  Moderate Onset quality:  Gradual Timing:  Constant Progression:  Unchanged Chronicity:  Chronic Relieved by:  None tried Ineffective treatments:  None tried Associated symptoms: no chest pain, no chills, no cough, no diarrhea, no fever, no nausea, no shortness of breath and no vomiting     HPI Comments: Luis Short is a 34 y.o. male without any pertinent past medical history who presents to the Emergency Department complaining of constant, ongoing, chronic in nature R sided abdominal pain x 3 months. Discomfort does not radiate at this time. Pain is exacerbated when laying down and alleviated when standing. Pt also reports ongoing numbness to all extremities onset 3 months. No OTC medications attempted for any of the above symptoms prior to arrival. No recent fever, chills, nausea, diarrhea, and vomiting. No known allergies to medications.  History reviewed. No pertinent past medical history. History reviewed. No pertinent past surgical history. Family History  Problem Relation Age of Onset  . Cancer Mother   . Diabetes Mother   . Hypertension Father   . Hypertension Brother    History  Substance Use Topics  . Smoking status: Never Smoker   . Smokeless tobacco: Never Used  . Alcohol Use: No    Review of Systems  Constitutional: Negative for fever and chills.  Respiratory: Negative for cough and shortness of breath.    Cardiovascular: Negative for chest pain.  Gastrointestinal: Positive for abdominal pain. Negative for nausea, vomiting and diarrhea.  Neurological: Positive for numbness.  All other systems reviewed and are negative.     Allergies  Review of patient's allergies indicates no known allergies.  Home Medications   Prior to Admission medications   Medication Sig Start Date End Date Taking? Authorizing Provider  acetaminophen (TYLENOL) 325 MG tablet Take 650 mg by mouth every 6 (six) hours as needed for mild pain.    Yes Historical Provider, MD   Triage Vitals: BP 106/67 mmHg  Pulse 52  Temp(Src) 97.4 F (36.3 C) (Oral)  Resp 16  SpO2 100%   Physical Exam  Constitutional: He is oriented to person, place, and time. He appears well-developed and well-nourished.  HENT:  Head: Normocephalic and atraumatic.  Eyes: Conjunctivae and EOM are normal.  Neck: Normal range of motion. Neck supple.  Cardiovascular: Normal rate, regular rhythm and normal heart sounds.   Pulmonary/Chest: Effort normal and breath sounds normal. No respiratory distress.  Abdominal: He exhibits no distension. There is tenderness in the right lower quadrant. There is no rebound and no guarding.  Musculoskeletal: Normal range of motion.  Neurological: He is alert and oriented to person, place, and time.  Skin: Skin is warm and dry.  Vitals reviewed.   ED Course  Procedures (including critical care time)  DIAGNOSTIC STUDIES: Oxygen Saturation is 100% on RA, Normal by my interpretation.    COORDINATION OF CARE: 3:15 AM- Will  order CBC, CMP, Lipase, urinalysis, CT abdomen pelvis with contrast, and EKG. Will give Toradol and Omnipaque. Discussed treatment plan with pt at bedside and pt agreed to plan.     Labs Review Labs Reviewed  CBC WITH DIFFERENTIAL/PLATELET - Abnormal; Notable for the following:    Hemoglobin 12.7 (*)    HCT 38.6 (*)    MCV 72.3 (*)    MCH 23.8 (*)    All other components within normal  limits  COMPREHENSIVE METABOLIC PANEL - Abnormal; Notable for the following:    Potassium 3.3 (*)    Total Protein 6.4 (*)    All other components within normal limits  LIPASE, BLOOD - Abnormal; Notable for the following:    Lipase 19 (*)    All other components within normal limits  URINALYSIS, ROUTINE W REFLEX MICROSCOPIC (NOT AT [facility]) - Abnormal; Notable for the following:    APPearance CLOUDY (*)    All other components within normal limits    Imaging Review Ct Abdomen Pelvis W Contrast  01/27/2015   CLINICAL DATA:  Intermittent right lower quadrant abdominal pain since March.  EXAM: CT ABDOMEN AND PELVIS WITH CONTRAST  TECHNIQUE: Multidetector CT imaging of the abdomen and pelvis was performed using the standard protocol following bolus administration of intravenous contrast.  CONTRAST:  OMNIPAQUE IOHEXOL 300 MG/ML  SOLN  COMPARISON:  08/16/2008  FINDINGS: The appendix is normal.  Bowel is unremarkable.  There is a 14 mm focal liver lesion in the right hepatic lobe dome posteriorly, unchanged from 08/16/2008. 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 There are otherwise normal appearances of the liver, gallbladder and bile ducts.  The spleen, pancreas, adrenals and kidneys are normal.  .No acute inflammatory changes are evident in the abdomen or pelvis. There is no ascites.  There is no abnormality in the lower chest.  There are degenerative changes of the sacroiliac joints with mild sclerosis and osteophyte formation. No significant skeletal lesion is evident.  IMPRESSION: 1. 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 2. No acute findings are evident in the abdomen or pelvis.   Electronically Signed   By: Ellery Plunk M.D.   On: 01/27/2015 05:25     EKG Interpretation   Date/Time:  Friday January 27 2015 03:26:29 EDT Ventricular Rate:  63 PR Interval:  185 QRS Duration: 85 QT Interval:  395 QTC Calculation: 404 R Axis:    67 Text Interpretation:  Sinus rhythm ST elevation suggests acute  pericarditis No significant change since last tracing Confirmed by Mirian Mo [phone]) on 01/27/2015 5:45:30 AM      MDM   Final diagnoses:  RLQ abdominal pain    34 y.o. male without pertinent PMH presents with RLQ abd pain as above.  He also complains of vague tingling in chest and all extremities, but no frank chest pain or dyspnea.  No fevers, no GI symptoms.  Exam as above with RLQ tenderness.  No testicular symptoms reported.   Wu unremarkable.  No hematuria or signs of nephrolithiasis.  CT scan negative. Likely early enteritis vs muscle spasm.    DC home in stable condition.  I have reviewed all laboratory and imaging studies if ordered as above  1. RLQ abdominal pain           Mirian Mo, MD 01/27/15 [phone]

## 2015-01-27 NOTE — ED Notes (Signed)
Pt. reports chronic right abdominal pain since April this year , denies emesis or diarrhea. No fever or chills. Pt. added intermittent headache this week .

## 2015-01-27 NOTE — ED Notes (Signed)
Patient transported to CT 

## 2015-02-09 ENCOUNTER — Ambulatory Visit (INDEPENDENT_AMBULATORY_CARE_PROVIDER_SITE_OTHER): Payer: No Typology Code available for payment source | Admitting: Emergency Medicine

## 2015-02-09 ENCOUNTER — Ambulatory Visit (INDEPENDENT_AMBULATORY_CARE_PROVIDER_SITE_OTHER): Payer: No Typology Code available for payment source

## 2015-02-09 VITALS — BP 104/60 | HR 62 | Temp 98.1°F | Resp 16 | Ht 67.0 in | Wt 154.6 lb

## 2015-02-09 DIAGNOSIS — R202 Paresthesia of skin: Secondary | ICD-10-CM

## 2015-02-09 DIAGNOSIS — M79603 Pain in arm, unspecified: Secondary | ICD-10-CM

## 2015-02-09 DIAGNOSIS — M79602 Pain in left arm: Secondary | ICD-10-CM

## 2015-02-09 DIAGNOSIS — R1031 Right lower quadrant pain: Secondary | ICD-10-CM | POA: Diagnosis not present

## 2015-02-09 DIAGNOSIS — M79601 Pain in right arm: Secondary | ICD-10-CM

## 2015-02-09 LAB — POCT CBC
Granulocyte percent: 66.3 %G (ref 37–80)
HCT, POC: 40 % — AB (ref 43.5–53.7)
HEMOGLOBIN: 12.7 g/dL — AB (ref 14.1–18.1)
LYMPH, POC: 2.3 (ref 0.6–3.4)
MCH: 22.6 pg — AB (ref 27–31.2)
MCHC: 31.7 g/dL — AB (ref 31.8–35.4)
MCV: 71.1 fL — AB (ref 80–97)
MID (CBC): 0.4 (ref 0–0.9)
MPV: 8.1 fL (ref 0–99.8)
PLATELET COUNT, POC: 212 10*3/uL (ref 142–424)
POC Granulocyte: 5.4 (ref 2–6.9)
POC LYMPH PERCENT: 28.5 %L (ref 10–50)
POC MID %: 5.2 % (ref 0–12)
RBC: 5.63 M/uL (ref 4.69–6.13)
RDW, POC: 14.3 %
WBC: 8.1 10*3/uL (ref 4.6–10.2)

## 2015-02-09 LAB — POCT UA - MICROSCOPIC ONLY
CASTS, UR, LPF, POC: NEGATIVE
Crystals, Ur, HPF, POC: NEGATIVE
YEAST UA: NEGATIVE

## 2015-02-09 LAB — POCT URINALYSIS DIPSTICK
Bilirubin, UA: NEGATIVE
Blood, UA: NEGATIVE
Glucose, UA: NEGATIVE
Ketones, UA: NEGATIVE
LEUKOCYTES UA: NEGATIVE
Nitrite, UA: NEGATIVE
PROTEIN UA: NEGATIVE
UROBILINOGEN UA: 0.2
pH, UA: 5.5

## 2015-02-09 NOTE — Progress Notes (Signed)
Subjective:  Patient ID: Luis Luis, male    DOB: 1980-08-30  Age: 34 y.o. MRN: [id number]  CC: Follow-up; Labs Only; and Annual Exam   HPI [name] presents  with numerous unrelated complaints. He said that initially came in to get a "checkup" then he described having neck pain for about 3 months it's intermittent he says his neck pops, he has tingling and paresthesias in his fingertips and his toes. History of neck injury. Denies any radiation of pain. He has no shoulder pain.  He describes intermittent pain in his right lower quadrant. He says the pain is not associated with nausea vomiting stool change. No blood in the stools or black stools. No fever or chills. Appetite is normal and his had no weight loss.  He said that he occasionally has pain in his right hip. Pain is not radiating and not associated with pain in his abdomen and doesn't have any weakness or other neurologic symptoms and related to his hip or leg. No history of injury or overuse. He has no tenderness or pain.  He's had no improvement with over-the-counter medication with any problems  History Mehul has no past medical history on file.   He has no past surgical history on file.   His  family history includes Cancer in his mother; Diabetes in his mother; Hypertension in his brother and father.  He   reports that he has never smoked. He has never used smokeless tobacco. He reports that he does not drink alcohol or use illicit drugs.  Outpatient Prescriptions Prior to Visit  Medication Sig Dispense Refill  . acetaminophen (TYLENOL) 325 MG tablet Take 650 mg by mouth every 6 (six) hours as needed for mild pain.      No facility-administered medications prior to visit.    History   Social History  . Marital Status: Single    Spouse Name: N/A  . Number of Children: N/A  . Years of Education: N/A   Social History Main Topics  . Smoking status: Never Smoker   . Smokeless tobacco: Never Used    . Alcohol Use: No  . Drug Use: No  . Sexual Activity: Not on file   Other Topics Concern  . None   Social History Narrative     Review of Systems  Constitutional: Negative for fever, chills and appetite change.  HENT: Negative for congestion, ear pain, postnasal drip, sinus pressure and sore throat.   Eyes: Negative for pain and redness.  Respiratory: Negative for cough, shortness of breath and wheezing.   Cardiovascular: Negative for leg swelling.  Gastrointestinal: Positive for abdominal pain. Negative for nausea, vomiting, diarrhea, constipation and blood in stool.  Endocrine: Negative for polyuria.  Genitourinary: Negative for dysuria, urgency, frequency and flank pain.  Musculoskeletal: Negative for gait problem.  Skin: Negative for rash.  Neurological: Positive for numbness. Negative for weakness and headaches.  Psychiatric/Behavioral: Negative for confusion and decreased concentration. The patient is not nervous/anxious.     Objective:  BP 104/60 mmHg  Pulse 62  Temp(Src) 98.1 F (36.7 C) (Oral)  Resp 16  Ht  (1.702 m)  Wt 154 lb 9.6 oz (70.126 kg)  BMI 24.21 kg/m2  SpO2 99%  Physical Exam  Constitutional: He is oriented to person, place, and time. He appears well-developed and well-nourished. No distress.  HENT:  Head: Normocephalic and atraumatic.  Right Ear: External ear normal.  Left Ear: External ear normal.  Nose: Nose normal.  Eyes:  Conjunctivae and EOM are normal. Pupils are equal, round, and reactive to light. No scleral icterus.  Neck: Normal range of motion. Neck supple. No tracheal deviation present.  Cardiovascular: Normal rate, regular rhythm and normal heart sounds.   Pulmonary/Chest: Effort normal. No respiratory distress. He has no wheezes. He has no rales.  Abdominal: He exhibits no mass. There is tenderness. There is no rebound and no guarding.  Musculoskeletal: He exhibits no edema.  Lymphadenopathy:    He has no cervical  adenopathy.  Neurological: He is alert and oriented to person, place, and time. Coordination normal.  Skin: Skin is warm and dry. No rash noted.  Psychiatric: He has a normal mood and affect. His behavior is normal.      Assessment & Plan:   Bravlio was seen today for follow-up, labs only and annual exam.  Diagnoses and all orders for this visit:  Right lower quadrant abdominal pain Orders: -     POCT CBC -     Comprehensive metabolic panel -     Lipid panel -     POCT urinalysis dipstick -     POCT UA - Microscopic Only -     CT Abdomen Pelvis W Contrast; Future -     Ambulatory referral to Neurology -     DG Cervical Spine Complete  Paresthesia and pain of both upper extremities Orders: -     POCT CBC -     Comprehensive metabolic panel -     Lipid panel -     POCT urinalysis dipstick -     POCT UA - Microscopic Only -     CT Abdomen Pelvis W Contrast; Future -     Ambulatory referral to Neurology -     DG Cervical Spine Complete   I am having Mr. Delpizzo maintain his acetaminophen.  No orders of the defined types were placed in this encounter.    His lab tests today were unremarkable he has some mild tenderness in right lower quadrant that's poorly reproducible. I suggest we obtain a CT scan of his abdomen to further find the cause of his pain. I also suggested we do a nerve conduction study following a normal cervical spine series.  Appropriate red flag conditions were discussed with the patient as well as actions that should be taken.  Patient expressed his understanding.  Follow-up: Return if symptoms worsen or fail to improve.  Carmelina Dane, MD   [facility] reading (PRIMARY) by  Dr. Dareen Piano.  Negative cspine.   Results for orders placed or performed in visit on 02/09/15  POCT CBC  Result Value Ref Range   WBC 8.1 4.6 - 10.2 K/uL   Lymph, poc 2.3 0.6 - 3.4   POC LYMPH PERCENT 28.5 10 - 50 %L   MID (cbc) 0.4 0 - 0.9   POC MID % 5.2 0 - 12 %M   POC  Granulocyte 5.4 2 - 6.9   Granulocyte percent 66.3 37 - 80 %G   RBC 5.63 4.69 - 6.13 M/uL   Hemoglobin 12.7 (A) 14.1 - 18.1 g/dL   HCT, POC 16.1 (A) 09.6 - 53.7 %   MCV 71.1 (A) 80 - 97 fL   MCH, POC 22.6 (A) 27 - 31.2 pg   MCHC 31.7 (A) 31.8 - 35.4 g/dL   RDW, POC 04.5 %   Platelet Count, POC 212 142 - 424 K/uL   MPV 8.1 0 - 99.8 fL  POCT urinalysis dipstick  Result  Value Ref Range   Color, UA yellow    Clarity, UA clear    Glucose, UA neg    Bilirubin, UA neg    Ketones, UA neg    Spec Grav, UA >=1.030    Blood, UA neg    pH, UA 5.5    Protein, UA neg    Urobilinogen, UA 0.2    Nitrite, UA neg    Leukocytes, UA Negative Negative  POCT UA - Microscopic Only  Result Value Ref Range   WBC, Ur, HPF, POC 3-5    RBC, urine, microscopic 1-3    Bacteria, U Microscopic 1+    Mucus, UA 2+    Epithelial cells, urine per micros 2-4    Crystals, Ur, HPF, POC neg    Casts, Ur, LPF, POC neg    Yeast, UA neg

## 2015-02-10 LAB — COMPREHENSIVE METABOLIC PANEL
ALT: 10 U/L (ref 0–53)
AST: 15 U/L (ref 0–37)
Albumin: 4.5 g/dL (ref 3.5–5.2)
Alkaline Phosphatase: 64 U/L (ref 39–117)
BILIRUBIN TOTAL: 0.7 mg/dL (ref 0.2–1.2)
BUN: 14 mg/dL (ref 6–23)
CHLORIDE: 104 meq/L (ref 96–112)
CO2: 29 mEq/L (ref 19–32)
Calcium: 9.7 mg/dL (ref 8.4–10.5)
Creat: 0.89 mg/dL (ref 0.50–1.35)
GLUCOSE: 81 mg/dL (ref 70–99)
Potassium: 4 mEq/L (ref 3.5–5.3)
SODIUM: 140 meq/L (ref 135–145)
TOTAL PROTEIN: 7.3 g/dL (ref 6.0–8.3)

## 2015-02-10 LAB — LIPID PANEL
Cholesterol: 91 mg/dL (ref 0–200)
HDL: 44 mg/dL (ref >=40)
LDL Cholesterol: 40 mg/dL (ref 0–99)
TRIGLYCERIDES: 33 mg/dL (ref <150)
Total CHOL/HDL Ratio: 2.1 Ratio
VLDL: 7 mg/dL (ref 0–40)

## 2015-03-06 ENCOUNTER — Ambulatory Visit (INDEPENDENT_AMBULATORY_CARE_PROVIDER_SITE_OTHER): Payer: Self-pay | Admitting: Neurology

## 2015-03-06 ENCOUNTER — Encounter: Payer: Self-pay | Admitting: Neurology

## 2015-03-06 ENCOUNTER — Ambulatory Visit (INDEPENDENT_AMBULATORY_CARE_PROVIDER_SITE_OTHER): Payer: No Typology Code available for payment source | Admitting: Neurology

## 2015-03-06 DIAGNOSIS — R202 Paresthesia of skin: Secondary | ICD-10-CM | POA: Insufficient documentation

## 2015-03-06 NOTE — Progress Notes (Signed)
Please refer to EMG and nerve conduction study procedure note. 

## 2015-03-06 NOTE — Procedures (Signed)
     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 The patient denies any weakness associated with the paresthesias. He is being evaluated for possible neuropathy or a cervical radiculopathy.  NERVE CONDUCTION STUDIES:  Nerve conduction studies were performed on both upper extremities. The distal motor latencies and motor amplitudes for the median and ulnar nerves were within normal limits. The F wave latencies and nerve conduction velocities for these nerves were also normal. The sensory latencies for the median and ulnar nerves were normal.   EMG STUDIES:  EMG study was performed on the right upper extremity:  The first dorsal interosseous muscle reveals 2 to 4 K units with full recruitment. No fibrillations or positive waves were noted. The abductor pollicis brevis muscle reveals 2 to 4 K units with full recruitment. No fibrillations or positive waves were noted. The extensor indicis proprius muscle reveals 1 to 3 K units with full recruitment. No fibrillations or positive waves were noted. The pronator teres muscle reveals 2 to 3 K units with full recruitment. No fibrillations or positive waves were noted. The biceps muscle reveals 1 to 2 K units with full recruitment. No fibrillations or positive waves were noted. The triceps muscle reveals 2 to 4 K units with full recruitment. No fibrillations or positive waves were noted. The anterior deltoid muscle reveals 2 to 3 K units with full recruitment. No fibrillations or positive waves were noted. The cervical paraspinal muscles were tested at 2 levels. No abnormalities of insertional activity were seen at either level tested. There was good relaxation.  A limited EMG study was performed on the left upper extremity:  The first dorsal interosseous muscle reveals 2 to 4 K units with full recruitment. No fibrillations or positive waves were  noted. The abductor pollicis brevis muscle reveals 2 to 4 K units with full recruitment. No fibrillations or positive waves were noted. The extensor indicis proprius muscle reveals 1 to 3 K units with full recruitment. No fibrillations or positive waves were noted.   IMPRESSION:  Nerve conduction studies done on both upper extremities were within normal limits. No evidence of a neuropathy is seen. EMG evaluation of the right upper extremity is unremarkable without evidence of an overlying cervical radiculopathy. A limited EMG of the left upper extremity is unremarkable.  Marlan Palau. Keith Delpha Perko MD 03/06/2015 1:50 PM  [facility] [address] [place], [place] [id number]  Phone [phone] Fax [phone]

## 2015-06-20 ENCOUNTER — Ambulatory Visit (INDEPENDENT_AMBULATORY_CARE_PROVIDER_SITE_OTHER): Payer: Worker's Compensation | Admitting: Emergency Medicine

## 2015-06-20 VITALS — BP 110/90 | HR 66 | Temp 98.3°F | Resp 20 | Ht 67.0 in | Wt 150.0 lb

## 2015-06-20 DIAGNOSIS — Z578 Occupational exposure to other risk factors: Secondary | ICD-10-CM

## 2015-06-20 NOTE — Patient Instructions (Signed)
Body Fluid Exposure Information  People may come into contact with blood and other body fluids under various circumstances. In some cases, body fluids may contain germs (bacteria or viruses) that cause infections. These germs can be spread when another person's body fluids come into contact with your skin, mouth, eyes, or genitals.   Exposure to body fluids that may contain infectious material is a common problem for people providing care for others who are ill. It can occur when a person is performing health care tasks in the workplace or when taking care of a family member at home. Other common methods of exposure include injection drug use, sharing needles, and sexual activity.  The risk of an infection spreading through body fluid exposure is small and depends on a variety of factors. This includes the type of body fluid, the nature of the exposure, and the health status of the person who was the source of the body fluids. Your health care provider can help you assess the risk.  WHAT TYPES OF BODY FLUID CAN SPREAD INFECTION?  The following types of body fluid have the potential to spread infections:   Blood.   Semen.   Vaginal secretions.   Urine.   Feces.   Saliva.   Nasal or eye discharge.   Breast milk.   Amniotic fluid and fluids surrounding body organs.  WHAT ARE SOME FIRST-AID MEASURES FOR BODY FLUID EXPOSURE?  The following steps should be taken as soon as possible after a person is exposed to body fluids:  Intact Skin   For contact with closed skin, wash the area with soap and water.  Broken Skin   For contact with broken skin (a wound), wash the area with soap and water. Let the area bleed a Pelle. Then place a bandage or clean towel on the wound, applying gentle pressure to stop the bleeding. Do not squeeze or rub the area.   Use just water or hand sanitizer if a sink with soap is not available.   Do not use harsh chemicals such as bleach or iodine.  Eyes   Rinse the eyes with water or  saline for 30 seconds.   If the person is wearing contact lenses, leave the contact lenses in while rinsing the eyes. Once the rinsing is complete, remove the contact lenses.  Mouth   Spit out the fluids. Rinse and spit with water 4-5 times.  In addition, you should remove any clothing that comes into contact with body fluids. However, if body fluid exposure results from sexual assault, seek medical care immediately without changing clothes or bathing.  WHEN SHOULD YOU SEEK HELP?  After performing the proper first-aid steps, you should contact your health care provider or seek emergency care right away if blood or other body fluids made contact with areas of broken skin or openings such as the eyes or mouth. If the exposure to body fluid happened in the workplace, you should report it to your work supervisor immediately. Many workplaces have procedures in place for exposure situations.  WHAT WILL HAPPEN AFTER YOU REPORT THE EXPOSURE?  Your health care provider will ask you several questions. Information requested may include:   Your medical history, including vaccination records.   Date and time of the exposure.   Whether you saw body fluids during the exposure.   Type of body fluid you were exposed to.   Volume of body fluid you were exposed to.   How the exposure happened.   If any devices,   such as a needle, were being used.   Which area of your body made contact with the body fluid.   Description of any injury to the skin or other area.   How long contact was made with the body fluid.   Any information you have about the health status of the person whose body fluid you were exposed to.  The health care provider will assess your risk of infection. Often, no treatment is necessary. In some cases, the health care provider may recommend doing blood tests right away. Follow-up blood tests may also be done at certain intervals during the upcoming weeks and months to check for changes. You may be offered  treatment to prevent an infection from developing after exposure (post-exposure prophylaxis). This may include certain vaccinations or medicines and may be necessary when there is a risk of a serious infection, such as HIV or hepatitis B. Your health care provider should discuss appropriate treatment and vaccinations with you.  HOW CAN YOU PREVENT EXPOSURE AND INFECTION?  Always remember that prevention is the first line of defense against body fluid exposure. To help prevent exposure to body fluids:   Wash and disinfect countertops and other surfaces regularly.   Wear appropriate protective gear such as gloves, gowns, or eyewear when the possibility of exposure is present.   Wipe away spills of body fluid with disposable towels.   Properly dispose of blood products and other fluids. Use secured bags.   Properly dispose of needles and other instruments with sharp points or edges (sharps). Use closed, marked containers.   Avoid injection drug use.   Do not share needles.   Avoid recapping needles.   Use a condom during sexual intercourse.   Make sure you learn and follow any guidelines for preventing exposure (universal precautions) provided at your workplace.  To help reduce your chances of getting an infection:   Make sure your vaccinations are up-to-date, including those for tetanus and hepatitis.   Wash your hands frequently with soap and water. Use hand sanitizers.   Avoid having multiple sex partners.   Follow up with your health care provider as directed after being evaluated for an exposure to body fluids.  To avoid spreading infection to others:   Do not have sexual relations until you know you are free of infection.   Do not donate blood, plasma, breast milk, sperm, or other body fluids.   Do not share hygiene tools such as toothbrushes, razors, or dental floss.   Keep open wounds covered.   Dispose of any items with blood on them (razors, tampons, bandages) by putting them in the  trash.   Do not share drug supplies with others, such as needles, syringes, straws, or pipes.   Follow all of your health care provider's instructions for preventing the spread of infection.     This information is not intended to replace advice given to you by your health care provider. Make sure you discuss any questions you have with your health care provider.     Document Released: 04/07/2013 Document Revised: 08/10/2013 Document Reviewed: 04/07/2013  Elsevier Interactive Patient Education 2016 [employer]

## 2015-06-20 NOTE — Progress Notes (Signed)
   Subjective:  Patient ID: Luis Short, male    DOB: 02/26/1981  Age: 34 y.o. MRN: [id number]  CC: OTHER and Depression   HPI Luis Short presents  for six-month follow-up following a potential body fluid exposure. His lab work was done 6 months ago was negative he is coming in for another HIV. From the standpoint of his laceration of fingers is well-healed is no residual effect History  Past medical family history and social history were reviewed and were noncontributory    Review of Systems   Review of systems was unremarkable  Objective:  BP 110/90 mmHg  Pulse 66  Temp(Src) 98.3 F (36.8 C) (Oral)  Resp 20  Ht 5\' 7"  (1.702 m)  Wt 150 lb (68.04 kg)  BMI 23.49 kg/m2  SpO2 99%  Physical Exam  Constitutional: He is oriented to person, place, and time. He appears well-developed and well-nourished. No distress.  HENT:  Head: Normocephalic and atraumatic.  Right Ear: External ear normal.  Left Ear: External ear normal.  Nose: Nose normal.  Eyes: Conjunctivae and EOM are normal. Pupils are equal, round, and reactive to light. No scleral icterus.  Neck: Normal range of motion. Neck supple. No tracheal deviation present.  Cardiovascular: Normal rate, regular rhythm and normal heart sounds.   Pulmonary/Chest: Effort normal. No respiratory distress. He has no wheezes. He has no rales.  Abdominal: He exhibits no mass. There is no tenderness. There is no rebound and no guarding.  Musculoskeletal: He exhibits no edema.  Lymphadenopathy:    He has no cervical adenopathy.  Neurological: He is alert and oriented to person, place, and time. Coordination normal.  Skin: Skin is warm and dry. No rash noted.  Psychiatric: He has a normal mood and affect. His behavior is normal.      Assessment & Plan:   Luis Short was seen today for other and depression.  Diagnoses and all orders for this visit:  Employee exposure to body fluids -     HIV antibody -     Comprehensive  metabolic panel   I am having Luis Short maintain his acetaminophen.  No orders of the defined types were placed in this encounter.    Appropriate red flag conditions were discussed with the patient as well as actions that should be taken.  Patient expressed his understanding.  Follow-up: Return if symptoms worsen or fail to improve.  Carmelina DaneAnderson, Lonnie Reth S, MD

## 2015-06-21 LAB — COMPREHENSIVE METABOLIC PANEL
ALK PHOS: 68 U/L (ref 40–115)
ALT: 9 U/L (ref 9–46)
AST: 16 U/L (ref 10–40)
Albumin: 4.4 g/dL (ref 3.6–5.1)
BILIRUBIN TOTAL: 1 mg/dL (ref 0.2–1.2)
BUN: 12 mg/dL (ref 7–25)
CO2: 27 mmol/L (ref 20–31)
CREATININE: 0.87 mg/dL (ref 0.60–1.35)
Calcium: 9.2 mg/dL (ref 8.6–10.3)
Chloride: 104 mmol/L (ref 98–110)
GLUCOSE: 98 mg/dL (ref 65–99)
POTASSIUM: 3.8 mmol/L (ref 3.5–5.3)
SODIUM: 140 mmol/L (ref 135–146)
TOTAL PROTEIN: 6.9 g/dL (ref 6.1–8.1)

## 2015-06-21 LAB — HIV ANTIBODY (ROUTINE TESTING W REFLEX): HIV: NONREACTIVE

## 2015-08-09 ENCOUNTER — Telehealth: Payer: Self-pay

## 2015-08-09 NOTE — Telephone Encounter (Signed)
Waiting on payment of $36.25 for 61 pages. From [employer].

## 2015-09-01 NOTE — Telephone Encounter (Signed)
Called [employer] spoke with Colin MuldersBrianna, she stated she had mailed the check on the 6th and we should be getting it any day now and to call her back next Friday if we have not gotten it.

## 2015-09-04 NOTE — Telephone Encounter (Signed)
Payment received and records faxed on 09/04/15 °

## 2015-09-06 DIAGNOSIS — Z0271 Encounter for disability determination: Secondary | ICD-10-CM

## 2016-02-07 ENCOUNTER — Ambulatory Visit (INDEPENDENT_AMBULATORY_CARE_PROVIDER_SITE_OTHER): Payer: BLUE CROSS/BLUE SHIELD | Admitting: Internal Medicine

## 2016-02-07 VITALS — BP 112/76 | HR 71 | Temp 98.6°F | Resp 16 | Ht 67.0 in | Wt 155.2 lb

## 2016-02-07 DIAGNOSIS — F39 Unspecified mood [affective] disorder: Secondary | ICD-10-CM | POA: Diagnosis not present

## 2016-02-07 DIAGNOSIS — R202 Paresthesia of skin: Secondary | ICD-10-CM | POA: Diagnosis not present

## 2016-02-07 DIAGNOSIS — N529 Male erectile dysfunction, unspecified: Secondary | ICD-10-CM | POA: Diagnosis not present

## 2016-02-07 DIAGNOSIS — M79603 Pain in arm, unspecified: Secondary | ICD-10-CM

## 2016-02-07 DIAGNOSIS — M79602 Pain in left arm: Principal | ICD-10-CM

## 2016-02-07 DIAGNOSIS — M79601 Pain in right arm: Secondary | ICD-10-CM

## 2016-02-07 DIAGNOSIS — L259 Unspecified contact dermatitis, unspecified cause: Secondary | ICD-10-CM

## 2016-02-07 LAB — POCT SEDIMENTATION RATE: POCT SED RATE: 5 mm/hr (ref 0–22)

## 2016-02-07 LAB — POCT CBC
Granulocyte percent: 68.8 %G (ref 37–80)
HEMATOCRIT: 41.6 % — AB (ref 43.5–53.7)
Hemoglobin: 13.7 g/dL — AB (ref 14.1–18.1)
LYMPH, POC: 1.8 (ref 0.6–3.4)
MCH, POC: 23.6 pg — AB (ref 27–31.2)
MCHC: 32.8 g/dL (ref 31.8–35.4)
MCV: 71.9 fL — AB (ref 80–97)
MID (cbc): 0.6 (ref 0–0.9)
MPV: 7.8 fL (ref 0–99.8)
POC GRANULOCYTE: 5.2 (ref 2–6.9)
POC LYMPH %: 23.3 % (ref 10–50)
POC MID %: 7.9 % (ref 0–12)
Platelet Count, POC: 207 10*3/uL (ref 142–424)
RBC: 5.79 M/uL (ref 4.69–6.13)
RDW, POC: 14.9 %
WBC: 7.6 10*3/uL (ref 4.6–10.2)

## 2016-02-07 LAB — POCT GLYCOSYLATED HEMOGLOBIN (HGB A1C): HEMOGLOBIN A1C: 5.6

## 2016-02-07 NOTE — Progress Notes (Signed)
Subjective:  By signing my name below, I, Luis Short, attest that this documentation has been prepared under the direction and in the presence of Ellamae Sia, MD. Electronically Signed: Stann Short, Scribe. 02/07/2016 , 5:22 PM .  Patient was seen in Room 8 .   Patient ID: Luis Short, male    DOB: [birth year], 35 y.o.   MRN: [id number] Chief Complaint  Patient presents with  . numbness all over his body  . swelling in his ankle  . Rash    on his neck   . Headache   HPI Luis Short is a 35 y.o. male who presents to [facility] complaining of numbness all over his body that started a couple months ago. He also complains having persistent tingling in his fingers, toes and in his neck. When he sits down for a while, he feels like he needs to get up to move around. He's felt numbness in his neck while at work yesterday and was advised to get checked out. He notes headache with blurry vision occasionally. He also reports having swelling in his ankles. He noticed pain over his RLQ. He's having a lot of constipation recently. He also mentioned urination going quick and then needing to urinate more. He denies trouble swallowing, nasal congestion, cough, shortness of breath, diarrhea, and unexpected change in weight. He's currently not on chronic medications. Recent episode of failing sex/erec dys--no am erections recently.  He admits that he worries too much. Mind moves too fast!! Made school hard tho he has proved everyone wrong by getting assoc degree and now being at [employer] while working full time. Has "down" episodes and knows mom is bipolar. No mania. Never misses work. he was diagnosed with schizophrenia by a therapist a few years ago, but refused to take meds   His mother has bipolar disorder with general anxiety. She passed away of lung cancer, and she was a smoker. He notes long history of family history of different cancers and diabetes. Father pros Ca 60s.  He's currently studying  at [employer].   Patient Active Problem List   Diagnosis Date Noted  . Paresthesias 03/06/2015  see neuro workup/past visits for care  Current outpatient prescriptions:  .  acetaminophen (TYLENOL) 325 MG tablet, Take 650 mg by mouth every 6 (six) hours as needed for mild pain. Reported on 02/07/2016, Disp: , Rfl:  No Known Allergies  Review of Systems  Constitutional: Negative for fever, chills and unexpected weight change.  HENT: Negative for congestion, hearing loss and trouble swallowing.   Respiratory: Negative for cough, shortness of breath and wheezing.   Cardiovascular: Negative for chest pain, palpitations and leg swelling.  Gastrointestinal: Positive for abdominal pain and constipation. Negative for diarrhea and blood in stool.  Endocrine: Negative for polydipsia and polyuria.  Genitourinary: Positive for frequency. Negative for dysuria, flank pain, discharge, scrotal swelling, difficulty urinating and testicular pain.  Musculoskeletal: Positive for myalgias and joint swelling. Negative for back pain, gait problem and neck stiffness.  Skin: Positive for rash.       Rash around neck  Neurological: Positive for numbness and headaches. Negative for tremors, speech difficulty and weakness.  Hematological: Does not bruise/bleed easily.  Psychiatric/Behavioral: Positive for sleep disturbance, dysphoric mood, decreased concentration and agitation. Negative for suicidal ideas, hallucinations, confusion and self-injury. The patient is nervous/anxious.        Objective:   Physical Exam  Constitutional: He is oriented to person, place, and time.  He appears well-developed and well-nourished. No distress.  HENT:  Head: Normocephalic and atraumatic.  Right Ear: External ear normal.  Left Ear: External ear normal.  Nose: Nose normal.  Mouth/Throat: Oropharynx is clear and moist.  Eyes: Conjunctivae and EOM are normal. Pupils are equal, round, and  reactive to light.  Neck: Neck supple. No thyromegaly present.  Cardiovascular: Normal rate, regular rhythm, normal heart sounds and intact distal pulses.   No murmur heard. Pulmonary/Chest: Effort normal and breath sounds normal. No respiratory distress.  Abdominal: Bowel sounds are normal. He exhibits no mass. There is no tenderness.  Musculoskeletal: Normal range of motion. He exhibits no edema.  Lymphadenopathy:    He has no cervical adenopathy.  Neurological: He is alert and oriented to person, place, and time. He has normal reflexes. No cranial nerve deficit. He exhibits normal muscle tone. Coordination normal.  Skin: Skin is warm and dry.  Contact derm with areas of hyperpig around neck  Psychiatric: He has a normal mood and affect. His behavior is normal. Judgment normal.  Nursing note and vitals reviewed.   BP 112/76 mmHg  Pulse 71  Temp(Src) 98.6 F (37 C) (Oral)  Resp 16  Ht 5\' 7"  (1.702 m)  Wt 155 lb 3.2 oz (70.398 kg)  BMI 24.30 kg/m2  SpO2 99%   Results for orders placed or performed in visit on 02/07/16  POCT CBC  Result Value Ref Range   WBC 7.6 4.6 - 10.2 K/uL   Lymph, poc 1.8 0.6 - 3.4   POC LYMPH PERCENT 23.3 10 - 50 %L   MID (cbc) 0.6 0 - 0.9   POC MID % 7.9 0 - 12 %M   POC Granulocyte 5.2 2 - 6.9   Granulocyte percent 68.8 37 - 80 %G   RBC 5.79 4.69 - 6.13 M/uL   Hemoglobin 13.7 (A) 14.1 - 18.1 g/dL   HCT, POC 16.141.6 (A) 09.643.5 - 53.7 %   MCV 71.9 (A) 80 - 97 fL   MCH, POC 23.6 (A) 27 - 31.2 pg   MCHC 32.8 31.8 - 35.4 g/dL   RDW, POC 04.514.9 %   Platelet Count, POC 207 142 - 424 K/uL   MPV 7.8 0 - 99.8 fL  POCT glycosylated hemoglobin (Hb A1C)  Result Value Ref Range   Hemoglobin A1C 5.6        Assessment & Plan:  Paresthesia and pain of both upper extremities - Plan: POCT CBC, POCT glycosylated hemoglobin (Hb A1C), TSH, POCT SEDIMENTATION RATE, CANCELED: Lipid panel  Erectile dysfunction, unspecified erectile dysfunction type - Plan: Comprehensive  metabolic panel, Testos,Total,Free and SHBG  Episodic mood disorder (HCC)--this should be cause of his ongoing problems---will ref to [facility]///he is amenable  Contact dermatitis--?nickel in jewelry//topicals to treat   No meds given Will notify labs Start exercising!! Stop junk food! Improve sleep habits

## 2016-02-07 NOTE — Patient Instructions (Signed)
     IF you received an x-ray today, you will receive an invoice from [facility]. Please contact [facility] at [phone] with questions or concerns regarding your invoice.   IF you received labwork today, you will receive an invoice from [facility]. Please contact [facility] at [phone] with questions or concerns regarding your invoice.   Our billing staff will not be able to assist you with questions regarding bills from these companies.  You will be contacted with the lab results as soon as they are available. The fastest way to get your results is to activate your My Chart account. Instructions are located on the last page of this paperwork. If you have not heard from us regarding the results in 2 weeks, please contact this office.      

## 2016-02-08 LAB — COMPREHENSIVE METABOLIC PANEL
ALK PHOS: 65 U/L (ref 40–115)
ALT: 15 U/L (ref 9–46)
AST: 19 U/L (ref 10–40)
Albumin: 4.1 g/dL (ref 3.6–5.1)
BUN: 12 mg/dL (ref 7–25)
CALCIUM: 9.2 mg/dL (ref 8.6–10.3)
CO2: 27 mmol/L (ref 20–31)
Chloride: 106 mmol/L (ref 98–110)
Creat: 1 mg/dL (ref 0.60–1.35)
GLUCOSE: 92 mg/dL (ref 65–99)
POTASSIUM: 4.3 mmol/L (ref 3.5–5.3)
Sodium: 142 mmol/L (ref 135–146)
Total Bilirubin: 0.6 mg/dL (ref 0.2–1.2)
Total Protein: 6.6 g/dL (ref 6.1–8.1)

## 2016-02-08 LAB — TSH: TSH: 2.6 m[IU]/L (ref 0.40–4.50)

## 2016-02-21 LAB — TESTOS,TOTAL,FREE AND SHBG (FEMALE)
SEX HORMONE BINDING GLOB.: 39 nmol/L (ref 10–50)
Testosterone, Free: 58.1 pg/mL (ref 35.0–155.0)
Testosterone,Total,LC/MS/MS: 593 ng/dL (ref 250–1100)

## 2016-02-23 ENCOUNTER — Telehealth: Payer: Self-pay

## 2016-02-23 NOTE — Telephone Encounter (Signed)
Spoke with pt and gave lab results all within normal range.  Included the testosterone results. Pt states he wants to come in today because "it is cold down there" and he is worried about why and wants to be checked out today.  Dr. Merla Richesoolittle, we saw his testosterone results were released on 7/5, within normal limits and we gave him these reports also.

## 2016-02-23 NOTE — Telephone Encounter (Signed)
-----   Message from Tonye Pearsonobert P Doolittle, MD sent at 02/13/2016  2:15 PM EDT ----- Seems odd that testos taking so long--you can let him know all else negative/normal

## 2016-03-26 IMAGING — CR DG ABDOMEN 1V
1 series · 1 of 1 positions shown · non-contrast
Comparison: None.

CLINICAL DATA: Right-sided abdominal pain.

EXAM:
ABDOMEN - 1 VIEW

[AP]
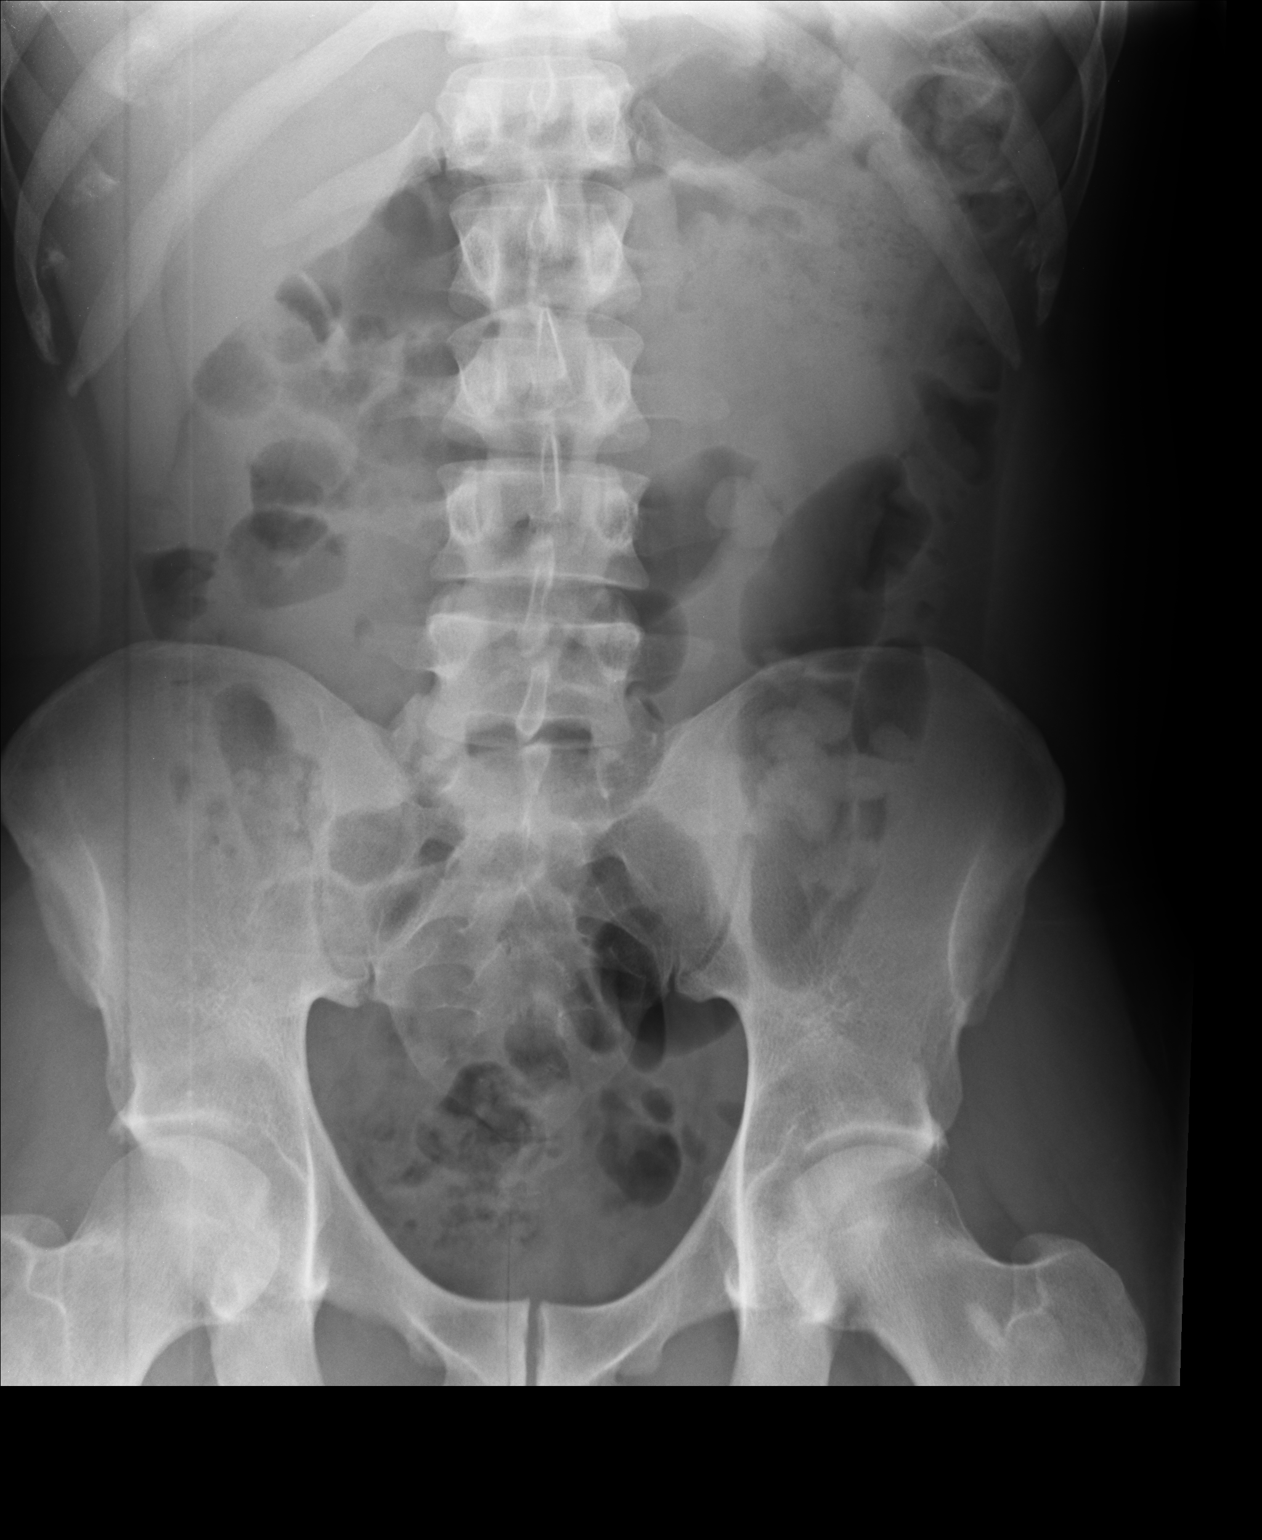

[1 of 1 positions shown; findings below may reference images not displayed]

FINDINGS: The bowel gas pattern is normal. No radio-opaque calculi or other
significant radiographic abnormality are seen.
IMPRESSION: No evidence of bowel obstruction or ileus.

## 2016-06-10 ENCOUNTER — Ambulatory Visit: Payer: Self-pay

## 2016-07-02 IMAGING — CR DG CERVICAL SPINE COMPLETE 4+V
4 series · 4 of 4 positions shown · non-contrast
Comparison: None.

CLINICAL DATA: Chronic neck pain without known injury. Initial
encounter.

EXAM:
CERVICAL SPINE  4+ VIEWS

[AP]
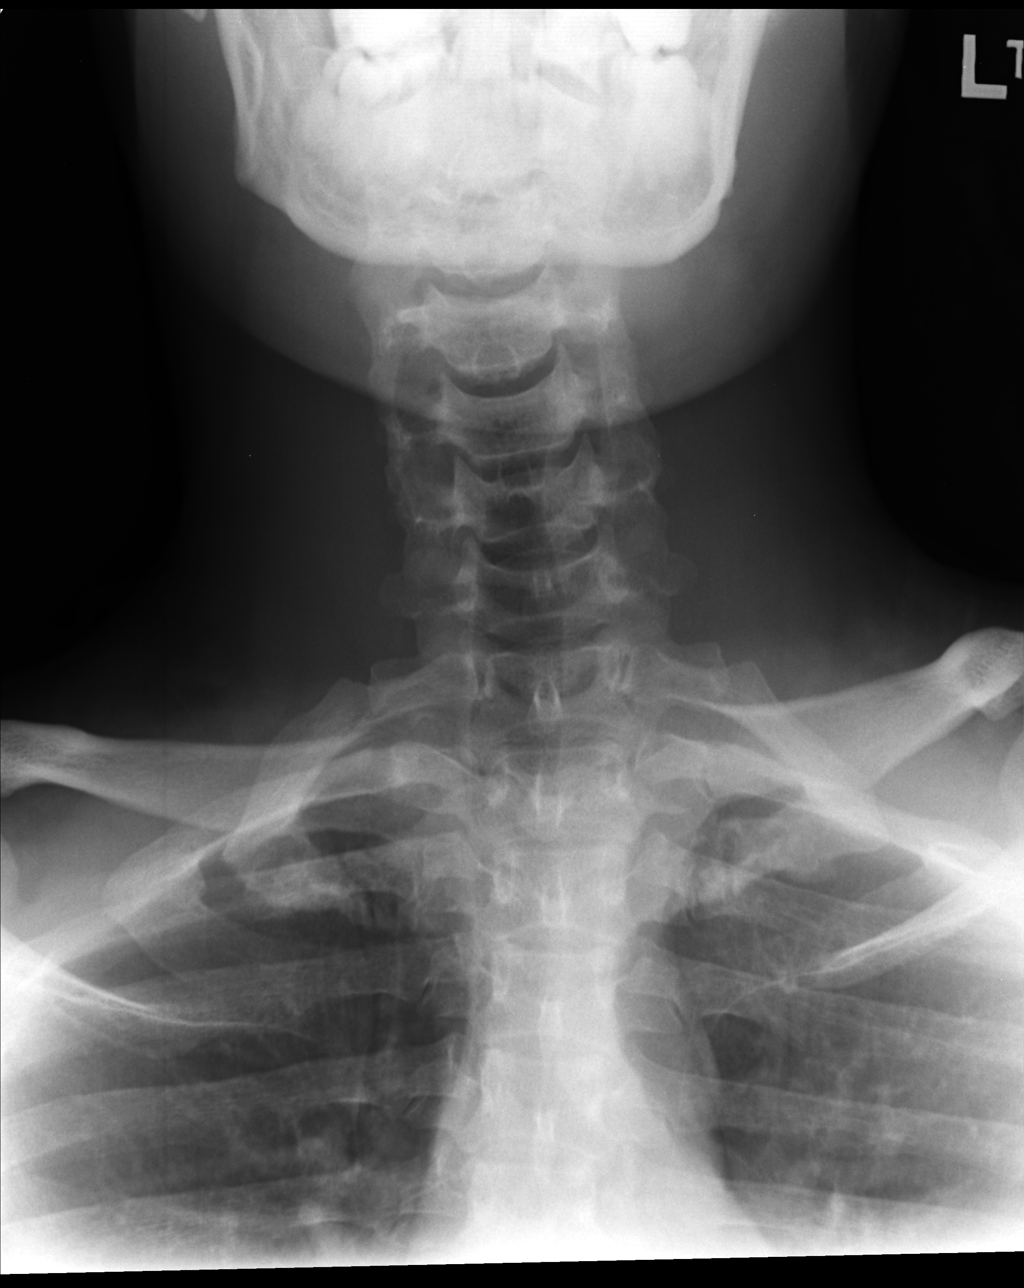

[ap open mouth]
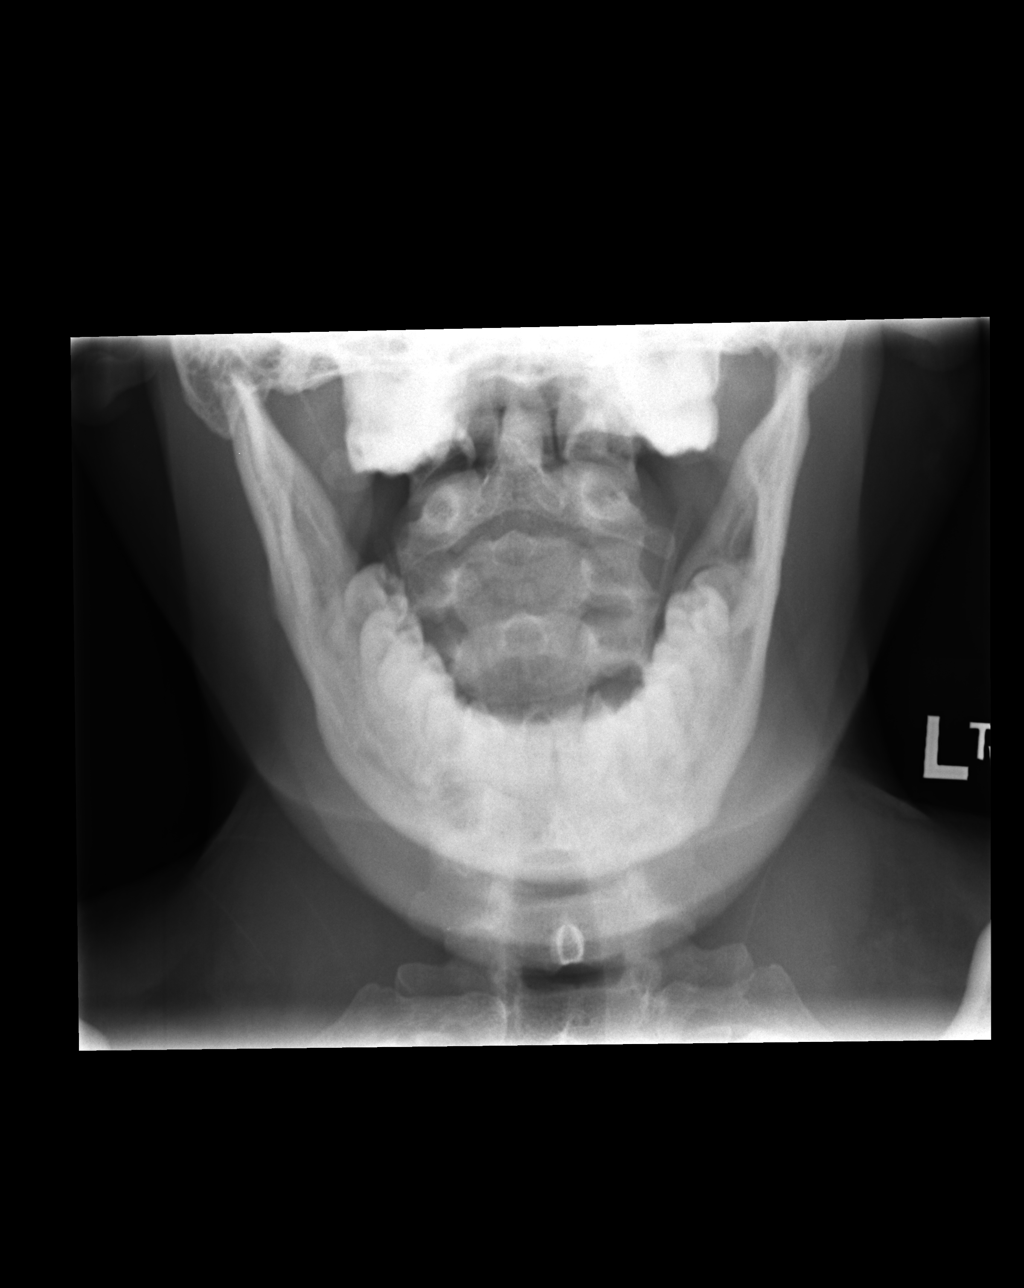

[lateral]
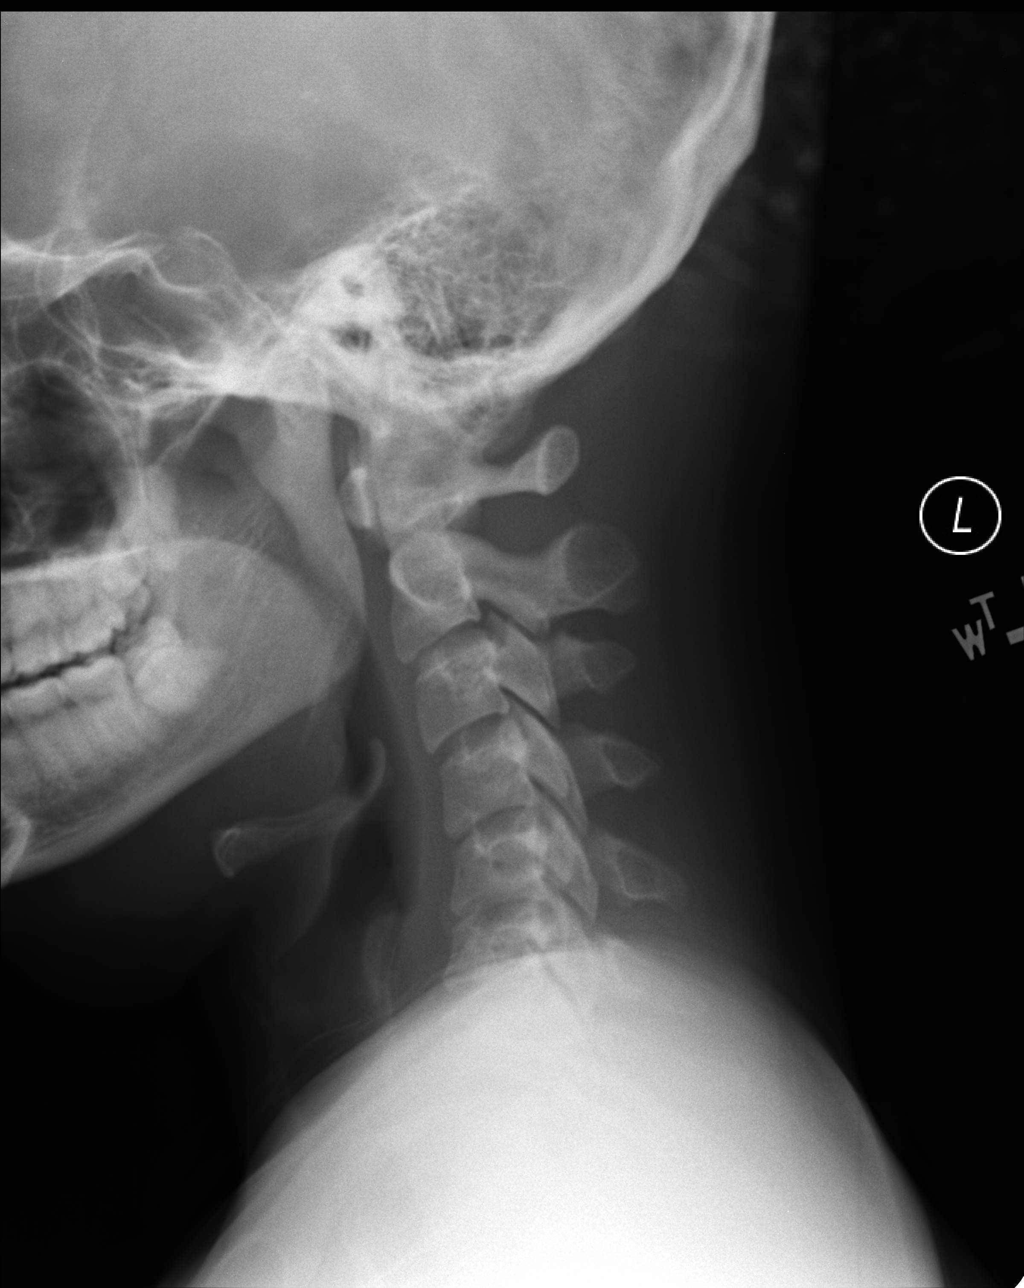

[swimmers]
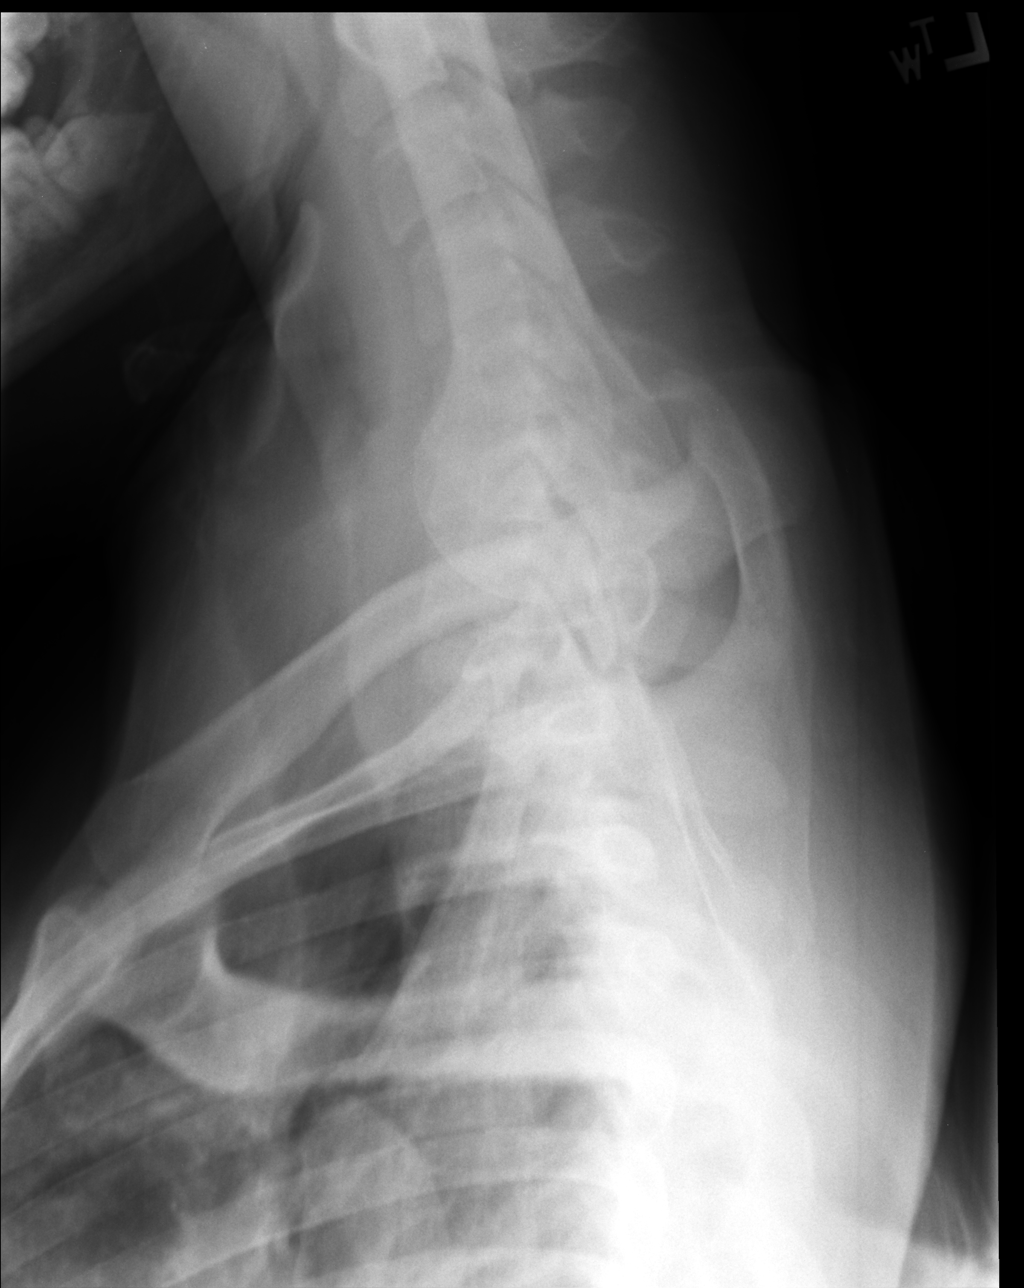

[4 of 4 positions shown; findings below may reference images not displayed]

FINDINGS: There is no evidence of cervical spine fracture or prevertebral soft
tissue swelling. No spondylolisthesis is noted. Reversal of normal
lordosis is noted which may be positional in origin. No other
significant bone abnormalities are identified.
IMPRESSION: No fracture or spondylolisthesis is noted. No significant
degenerative changes noted.

## 2016-11-04 ENCOUNTER — Emergency Department (HOSPITAL_COMMUNITY)
Admission: EM | Admit: 2016-11-04 | Discharge: 2016-11-05 | Disposition: A | Discharge status: Home / Self Care | Payer: BLUE CROSS/BLUE SHIELD | Attending: Emergency Medicine | Admitting: Emergency Medicine

## 2016-11-04 ENCOUNTER — Encounter (HOSPITAL_COMMUNITY): Payer: Self-pay | Admitting: Emergency Medicine

## 2016-11-04 DIAGNOSIS — R51 Headache: Secondary | ICD-10-CM | POA: Diagnosis not present

## 2016-11-04 DIAGNOSIS — R519 Headache, unspecified: Secondary | ICD-10-CM

## 2016-11-04 NOTE — ED Triage Notes (Signed)
Pt c/o headache and tingling/numbness to fingers and toes; reports discolored stool with dark color and also light red; endorses constipation

## 2016-11-04 NOTE — ED Notes (Signed)
Spoke with Trinna PostAlex, PA provider, patient will need additional test that is more extensive than fast track services. Pt understands and ambulated to lobby.

## 2016-11-05 LAB — POC OCCULT BLOOD, ED: FECAL OCCULT BLD: NEGATIVE

## 2016-11-05 MED ORDER — IBUPROFEN 600 MG PO TABS: 600.0000 mg | ORAL_TABLET | Freq: Four times a day (QID) | ORAL | 0 refills | Status: AC | PRN

## 2016-11-05 MED ORDER — METOCLOPRAMIDE HCL 5 MG/ML IJ SOLN
10.0000 mg | Freq: Once | INTRAMUSCULAR | Status: AC
  Administered 2016-11-05: 10 mg via INTRAVENOUS
  Filled 2016-11-05: qty 2

## 2016-11-05 MED ORDER — KETOROLAC TROMETHAMINE 15 MG/ML IJ SOLN
15.0000 mg | Freq: Once | INTRAMUSCULAR | Status: AC
  Administered 2016-11-05: 15 mg via INTRAVENOUS
  Filled 2016-11-05: qty 1

## 2016-11-05 NOTE — ED Provider Notes (Signed)
[facility]-EMERGENCY DEPT Provider Note   CSN: [id number] Arrival date & time: 11/04/16  2225  By signing my name below, I, Marnette Burgessyan Andrew Long, attest that this documentation has been prepared under the direction and in the presence of Derwood KaplanAnkit Alexi Dorminey, MD. Electronically Signed: Christian Mateyan Andrew Long, Scribe. 11/05/2016. 3:48 AM.  History   Chief Complaint Chief Complaint  Patient presents with  . Multiple Complaints   The history is provided by the patient and medical records. No language interpreter was used.    HPI Comments:  Luis Short is a 36 y.o. male with no pertinent PMHx, who presents to the Emergency Department complaining of sudden onset 8/10 HA beginning early yesterday. Pt reports for the past month he has associated symptoms of intermittent 8/10 neck pain, left foot pain, and generalized abdominal pain with the HA arising today PTA. No recent head or neck trauma stated. Pt states he also has associated symptoms of tingling in his bilateral fingers and toes, some constipation, and hematochezia onset two weeks. He did not try anything at home for relief of his symptoms. No h/o EtOH abuse or IV drug use. Long oral FHx of CA stated. Pt denies any other complaints at this time.    CHAPERONE PRESENT FOR RECTAL EXAM  History reviewed. No pertinent past medical history.  Patient Active Problem List   Diagnosis Date Noted  . Paresthesias 03/06/2015   History reviewed. No pertinent surgical history.  Home Medications    Prior to Admission medications   Medication Sig Start Date End Date Taking? Authorizing Provider  acetaminophen (TYLENOL) 325 MG tablet Take 650 mg by mouth every 6 (six) hours as needed for mild pain. Reported on 02/07/2016   Yes Historical Provider, MD  ibuprofen (ADVIL,MOTRIN) 600 MG tablet Take 1 tablet (600 mg total) by mouth every 6 (six) hours as needed. 11/05/16   Derwood KaplanAnkit Monnica Saltsman, MD    Family History Family History  Problem Relation Age of Onset  . Cancer  Mother   . Diabetes Mother   . Hypertension Father   . Hypertension Brother     Social History Social History  Substance Use Topics  . Smoking status: Never Smoker  . Smokeless tobacco: Never Used  . Alcohol use No     Allergies   Patient has no known allergies.   Review of Systems Review of Systems  Gastrointestinal: Positive for blood in stool and constipation.  Musculoskeletal: Positive for myalgias and neck pain.  Neurological: Positive for headaches.   10 Systems reviewed and are negative for acute change except as noted in the HPI.   Physical Exam Updated Vital Signs BP 140/85 (BP Location: Right Arm)   Pulse 82   Temp 98.1 F (36.7 C) (Oral)   Resp 16   SpO2 96%   Physical Exam  Constitutional: He is oriented to person, place, and time. He appears well-developed and well-nourished.  HENT:  Head: Normocephalic.  Eyes: Conjunctivae are normal.  Neck:  No meningismus. No midline C-spine tenderness.    Cardiovascular: Normal rate and regular rhythm.   Pulmonary/Chest: Effort normal and breath sounds normal.  Lungs clear.   Abdominal: He exhibits no distension.  Musculoskeletal: Normal range of motion.  Neurological: He is alert and oriented to person, place, and time.  Skin: Skin is warm and dry.  Psychiatric: He has a normal mood and affect.  Nursing note and vitals reviewed.  CHAPERONE PRESENT FOR RECTAL EXAM  ED Treatments / Results  DIAGNOSTIC STUDIES:  Oxygen Saturation  is 96% on RA, normal by my interpretation.    COORDINATION OF CARE:  3:44 AM Discussed treatment plan with pt at bedside including UA, pain medication, and rectal exam and pt agreed to plan.  Labs (all labs ordered are listed, but only abnormal results are displayed) Labs Reviewed  POC OCCULT BLOOD, ED    EKG  EKG Interpretation None       Radiology No results found.  Procedures Procedures (including critical care time)  Medications Ordered in ED Medications   ketorolac (TORADOL) 15 MG/ML injection 15 mg (15 mg Intravenous Given 11/05/16 0414)  metoCLOPramide (REGLAN) injection 10 mg (10 mg Intravenous Given 11/05/16 0414)     Initial Impression / Assessment and Plan / ED Course  I have reviewed the triage vital signs and the nursing notes.  Pertinent labs & imaging results that were available during my care of the patient were reviewed by me and considered in my medical decision making (see chart for details).  Clinical Course as of Nov 05 529  Tue Nov 05, 2016  0530 Upon reassessment, patient reports that the headache has resolved. She continued to have no neurologic complains. Strict return precautions discussed, pt will return to the ER if there is visual complains, seizures, altered mental status, loss of consciousness, dizziness, new focal weakness, or numbness.     [AN]    Clinical Course User Index [AN] Derwood Kaplan, MD    Pt comes in with multiple non specific complains. Headache is worst. Pt has no associated nausea, vomiting, seizures, loss of consciousness or new visual complains, weakness, dizziness or gait instability. He has numbness on his hands bilaterally. No midline cspine tenderness.  Pt also c/o bloody stools. POCT stools are neg.  Final Clinical Impressions(s) / ED Diagnoses   Final diagnoses:  Acute nonintractable headache, unspecified headache type    New Prescriptions New Prescriptions   IBUPROFEN (ADVIL,MOTRIN) 600 MG TABLET    Take 1 tablet (600 mg total) by mouth every 6 (six) hours as needed.   I personally performed the services described in this documentation, which was scribed in my presence. The recorded information has been reviewed and is accurate.     Derwood Kaplan, MD 11/05/16 [phone]

## 2016-11-05 NOTE — Discharge Instructions (Signed)
We saw you in the ER for headaches, neck pain and numbness. We are not sure what is causing your headaches, however, there appears to be no evidence of infection, bleeds or tumors based on our exam and results.  Please take motrin round the clock for the next 6 hours, and take other meds prescribed only for break through pain. See your doctor if the pain persists, as you might need better medications or a specialist.

## 2016-11-05 NOTE — ED Notes (Signed)
Assisted Dr.Nanavati for rectal examination and Hemoccult testing.

## 2016-11-13 ENCOUNTER — Encounter (HOSPITAL_COMMUNITY): Payer: Self-pay

## 2016-11-13 ENCOUNTER — Emergency Department (HOSPITAL_COMMUNITY)
Admission: EM | Admit: 2016-11-13 | Discharge: 2016-11-13 | Disposition: A | Discharge status: Home / Self Care | Payer: BLUE CROSS/BLUE SHIELD | Attending: Emergency Medicine | Admitting: Emergency Medicine

## 2016-11-13 DIAGNOSIS — J320 Chronic maxillary sinusitis: Secondary | ICD-10-CM | POA: Diagnosis not present

## 2016-11-13 DIAGNOSIS — Z79899 Other long term (current) drug therapy: Secondary | ICD-10-CM | POA: Diagnosis not present

## 2016-11-13 DIAGNOSIS — R0981 Nasal congestion: Secondary | ICD-10-CM | POA: Diagnosis present

## 2016-11-13 MED ORDER — AMOXICILLIN-POT CLAVULANATE 875-125 MG PO TABS: 1.0000 | ORAL_TABLET | Freq: Two times a day (BID) | ORAL | 0 refills | Status: DC

## 2016-11-13 NOTE — Discharge Instructions (Signed)
Return if any problems.

## 2016-11-13 NOTE — ED Triage Notes (Signed)
Pt complains of congestion and a cough for three days, today he saw blood in his sputum and he has a low grade fever

## 2016-11-14 NOTE — ED Provider Notes (Signed)
[facility]-EMERGENCY DEPT Provider Note   CSN: [id number] Arrival date & time: 11/13/16  2006     History   Chief Complaint Chief Complaint  Patient presents with  . URI    HPI Luis Short is a 36 y.o. male.  No language interpreter was used.  URI   This is a new problem. The current episode started more than 2 days ago. The problem has been gradually worsening. There has been no fever. Associated symptoms include congestion and sinus pain. He has tried nothing for the symptoms. The treatment provided no relief.    History reviewed. No pertinent past medical history.  Patient Active Problem List   Diagnosis Date Noted  . Paresthesias 03/06/2015    History reviewed. No pertinent surgical history.     Home Medications    Prior to Admission medications   Medication Sig Start Date End Date Taking? Authorizing Provider  acetaminophen (TYLENOL) 325 MG tablet Take 650 mg by mouth every 6 (six) hours as needed for mild pain. Reported on 02/07/2016    Historical Provider, MD  amoxicillin-clavulanate (AUGMENTIN) 875-125 MG tablet Take 1 tablet by mouth 2 (two) times daily. 11/13/16   Elson AreasLeslie K Zarian Colpitts, PA-C  ibuprofen (ADVIL,MOTRIN) 600 MG tablet Take 1 tablet (600 mg total) by mouth every 6 (six) hours as needed. 11/05/16   Derwood KaplanAnkit Nanavati, MD    Family History Family History  Problem Relation Age of Onset  . Cancer Mother   . Diabetes Mother   . Hypertension Father   . Hypertension Brother     Social History Social History  Substance Use Topics  . Smoking status: Never Smoker  . Smokeless tobacco: Never Used  . Alcohol use No     Allergies   Patient has no known allergies.   Review of Systems Review of Systems  HENT: Positive for congestion and sinus pain.   All other systems reviewed and are negative.    Physical Exam Updated Vital Signs BP 123/80 (BP Location: Left Arm)   Pulse 94   Temp 99.1 F (37.3 C) (Oral)   Resp 18   Ht 5\' 7"  (1.702 m)   Wt 73.5  kg   SpO2 100%   BMI 25.37 kg/m   Physical Exam  Constitutional: He appears well-developed and well-nourished.  HENT:  Head: Normocephalic and atraumatic.  Tender maxillary sinuses  Eyes: Conjunctivae are normal.  Neck: Neck supple.  Cardiovascular: Normal rate and regular rhythm.   No murmur heard. Pulmonary/Chest: Effort normal and breath sounds normal. No respiratory distress.  Abdominal: Soft. There is no tenderness.  Musculoskeletal: He exhibits no edema.  Neurological: He is alert.  Skin: Skin is warm and dry.  Psychiatric: He has a normal mood and affect.  Nursing note and vitals reviewed.    ED Treatments / Results  Labs (all labs ordered are listed, but only abnormal results are displayed) Labs Reviewed - No data to display  EKG  EKG Interpretation None       Radiology No results found.  Procedures Procedures (including critical care time)  Medications Ordered in ED Medications - No data to display   Initial Impression / Assessment and Plan / ED Course  I have reviewed the triage vital signs and the nursing notes.  Pertinent labs & imaging results that were available during my care of the patient were reviewed by me and considered in my medical decision making (see chart for details).       Final Clinical Impressions(s) /  ED Diagnoses   Final diagnoses:  Maxillary sinusitis, unspecified chronicity    New Prescriptions Discharge Medication List as of 11/13/2016 10:54 PM    START taking these medications   Details  amoxicillin-clavulanate (AUGMENTIN) 875-125 MG tablet Take 1 tablet by mouth 2 (two) times daily., Starting Wed 11/13/2016, Print      An After Visit Summary was printed and given to the patient.   Lonia Skinner [place], PA-C 11/14/16 0127    Lavera Guise, MD 11/14/16 [phone]

## 2017-01-12 ENCOUNTER — Encounter (HOSPITAL_COMMUNITY): Payer: Self-pay | Admitting: *Deleted

## 2017-01-12 ENCOUNTER — Emergency Department (HOSPITAL_COMMUNITY)
Admission: EM | Admit: 2017-01-12 | Discharge: 2017-01-12 | Disposition: A | Discharge status: Home / Self Care | Payer: BLUE CROSS/BLUE SHIELD | Attending: Emergency Medicine | Admitting: Emergency Medicine

## 2017-01-12 DIAGNOSIS — R300 Dysuria: Secondary | ICD-10-CM | POA: Insufficient documentation

## 2017-01-12 DIAGNOSIS — R609 Edema, unspecified: Secondary | ICD-10-CM

## 2017-01-12 DIAGNOSIS — R202 Paresthesia of skin: Secondary | ICD-10-CM | POA: Diagnosis not present

## 2017-01-12 DIAGNOSIS — G8929 Other chronic pain: Secondary | ICD-10-CM | POA: Insufficient documentation

## 2017-01-12 DIAGNOSIS — R600 Localized edema: Secondary | ICD-10-CM | POA: Insufficient documentation

## 2017-01-12 DIAGNOSIS — R519 Headache, unspecified: Secondary | ICD-10-CM

## 2017-01-12 DIAGNOSIS — R51 Headache: Secondary | ICD-10-CM | POA: Insufficient documentation

## 2017-01-12 LAB — URINALYSIS, ROUTINE W REFLEX MICROSCOPIC
Bilirubin Urine: NEGATIVE
Glucose, UA: NEGATIVE mg/dL
HGB URINE DIPSTICK: NEGATIVE
Ketones, ur: NEGATIVE mg/dL
LEUKOCYTES UA: NEGATIVE
NITRITE: NEGATIVE
PROTEIN: NEGATIVE mg/dL
SPECIFIC GRAVITY, URINE: 1.028 (ref 1.005–1.030)
pH: 6 (ref 5.0–8.0)

## 2017-01-12 MED ORDER — KETOROLAC TROMETHAMINE 30 MG/ML IJ SOLN
30.0000 mg | Freq: Once | INTRAMUSCULAR | Status: AC
  Administered 2017-01-12: 30 mg via INTRAMUSCULAR
  Filled 2017-01-12: qty 1

## 2017-01-12 NOTE — ED Provider Notes (Signed)
[facility] DEPT Provider Note   CSN: [id number] Arrival date & time: 01/12/17  1346     History   Chief Complaint Chief Complaint  Patient presents with  . Joint Swelling  . Tingling    HPI Luis Short is a 36 y.o. male with a PMHx of chronic migratory paresthesias (neg EMG/NCS in 02/2015), chronic intermittent peripheral edema, and chronic abd pain, with several ED visits for similar complaints over the last few years (most recently 10/2016), who presents to the ED with multiple complaints. His main complaint is one day of intermittent headaches which are the same as the headaches that he has been experiencing over the last 2-3 months, and burning/tingling dysuria x1 wk. He states that today's headache is described as 5/10 intermittent aching in the frontal and bilateral temporal areas, nonradiating, worse with bright lights, and mildly improved with Tylenol and Aleve. States this is the same headache as he has had in the past. As for the dysuria, he states this has been present with each urination over the last 1 week, describing it as a burning tingling sensation when he urinates. Among his other complaints today, which are all chronic and recurrent, he is reporting 2 months of intermittent bilateral ankle swelling that improves with elevation, and intermittent migratory paresthesias in different areas of his body. He's had a myriad of testing done to workup these complaints, done both by urgent care last year and neurology 32yr ago, with no definite diagnosis or abnormal findings. These are not acute or different today. He also mentions that he's had intermittent abd pain for a few months (per chart review, it's been going on at least 271yrif not longer) but this isn't ongoing today.   He denies fevers, chills, vision changes, lightheadedness, syncope, CP, SOB, ongoing abd pain, N/V/D/C, hematuria, penile discharge, testicular pain/swelling, specific new/worsening myalgias/arthralgias,  focal numbness, focal weakness, or any other acute complaints at this time. Denies injuries or trauma anywhere on his body, specifically the ankles or head trauma. Denies recent travel/surgery/immobilization, or personal/family hx of DVT/PE.    The history is provided by the patient and medical records. No language interpreter was used.  Headache   This is a recurrent problem. The current episode started yesterday. Episode frequency: intermittent. The problem has not changed since onset.The headache is associated with bright light. The pain is located in the frontal and temporal region. Quality: aching. The pain is at a severity of 5/10. The pain is mild. The pain does not radiate. Pertinent negatives include no fever, no near-syncope, no shortness of breath, no nausea and no vomiting. He has tried NSAIDs and acetaminophen for the symptoms. The treatment provided mild relief.    History reviewed. No pertinent past medical history.  Patient Active Problem List   Diagnosis Date Noted  . Paresthesias 03/06/2015    History reviewed. No pertinent surgical history.     Home Medications    Prior to Admission medications   Medication Sig Start Date End Date Taking? Authorizing Provider  acetaminophen (TYLENOL) 325 MG tablet Take 650 mg by mouth every 6 (six) hours as needed for mild pain. Reported on 02/07/2016    [provider]  amoxicillin-clavulanate (AUGMENTIN) 875-125 MG tablet Take 1 tablet by mouth 2 (two) times daily. 11/13/16   SoFransico MeadowPA-C  ibuprofen (ADVIL,MOTRIN) 600 MG tablet Take 1 tablet (600 mg total) by mouth every 6 (six) hours as needed. 11/05/16   NaVarney BilesMD    Family History  Family History  Problem Relation Age of Onset  . Cancer Mother   . Diabetes Mother   . Hypertension Father   . Hypertension Brother     Social History Social History  Substance Use Topics  . Smoking status: Never Smoker  . Smokeless tobacco: Never Used  . Alcohol  use No     Allergies   Patient has no known allergies.   Review of Systems Review of Systems  Constitutional: Negative for chills and fever.  Eyes: Negative for visual disturbance.  Respiratory: Negative for shortness of breath.   Cardiovascular: Negative for chest pain and near-syncope.  Gastrointestinal: Negative for abdominal pain (intermittent chronic abd pain but none ongoing today), constipation, diarrhea, nausea and vomiting.  Genitourinary: Positive for dysuria. Negative for discharge, hematuria, scrotal swelling and testicular pain.  Musculoskeletal: Positive for joint swelling (b/l ankles x2 months intermittently). Negative for arthralgias and myalgias.  Skin: Negative for color change.  Allergic/Immunologic: Negative for immunocompromised state.  Neurological: Positive for headaches. Negative for syncope, weakness, light-headedness and numbness.       +intermittent migratory paresthesias (chronic)  Psychiatric/Behavioral: Negative for confusion.   All other systems reviewed and are negative for acute change except as noted in the HPI.    Physical Exam Updated Vital Signs BP 107/71 (BP Location: Left Arm)   Pulse 61   Temp 98.3 F (36.8 C) (Oral)   Resp 16   SpO2 98%   Physical Exam  Constitutional: He is oriented to person, place, and time. Vital signs are normal. He appears well-developed and well-nourished.  Non-toxic appearance. No distress.  Afebrile, nontoxic, NAD, playing on his cell phone, laying in bed  HENT:  Head: Normocephalic and atraumatic.  Mouth/Throat: Oropharynx is clear and moist and mucous membranes are normal.  Eyes: Conjunctivae and EOM are normal. Pupils are equal, round, and reactive to light. Right eye exhibits no discharge. Left eye exhibits no discharge.  PERRL, EOMI, no nystagmus, no visual field deficits   Neck: Normal range of motion. Neck supple. No spinous process tenderness and no muscular tenderness present. No neck rigidity.  Normal range of motion present.  FROM intact without spinous process TTP, no bony stepoffs or deformities, no paraspinous muscle TTP or muscle spasms. No rigidity or meningeal signs. No bruising or swelling.   Cardiovascular: Normal rate, regular rhythm, normal heart sounds and intact distal pulses.  Exam reveals no gallop and no friction rub.   No murmur heard. Pulmonary/Chest: Effort normal and breath sounds normal. No respiratory distress. He has no decreased breath sounds. He has no wheezes. He has no rhonchi. He has no rales.  Abdominal: Soft. Normal appearance and bowel sounds are normal. He exhibits no distension. There is no tenderness. There is no rigidity, no rebound, no guarding, no CVA tenderness, no tenderness at McBurney's point and negative Murphy's sign.  Musculoskeletal: Normal range of motion.  MAE x4 Strength and sensation grossly intact in all extremities Distal pulses intact Gait steady Very scant almost unappreciable b/l ankle swelling mostly around the sock line, does not extend into the calves, no pitting edema noted, no skin discoloration or tenderness, neg homan's bilaterally, FROM intact in both ankles  Neurological: He is alert and oriented to person, place, and time. He has normal strength. No cranial nerve deficit or sensory deficit. Coordination and gait normal. GCS eye subscore is 4. GCS verbal subscore is 5. GCS motor subscore is 6.  CN 2-12 grossly intact A&O x4 GCS 15 Sensation and strength intact  Gait nonataxic including with tandem walking Coordination with finger-to-nose WNL Neg pronator drift   Skin: Skin is warm, dry and intact. No rash noted.  Psychiatric: He has a normal mood and affect.  Nursing note and vitals reviewed.    ED Treatments / Results  Labs (all labs ordered are listed, but only abnormal results are displayed) Labs Reviewed  URINALYSIS, ROUTINE W REFLEX MICROSCOPIC  RPR  HIV ANTIBODY (ROUTINE TESTING)  GC/CHLAMYDIA PROBE AMP  ([place]) NOT AT [facility]    EKG  EKG Interpretation None       Radiology No results found.  Procedures Procedures (including critical care time)  EMG/NCS 02/2017 IMPRESSION: Nerve conduction studies done on both upper extremities were within normal limits. No evidence of a neuropathy is seen. EMG evaluation of the right upper extremity is unremarkable without evidence of an overlying cervical radiculopathy. A limited EMG of the left upper extremity is unremarkable.  Jill Alexanders MD 03/06/2015 1:50 PM [facility] [address] [place],  [address] Phone [phone] Fax [phone]    Medications Ordered in ED Medications  ketorolac (TORADOL) 30 MG/ML injection 30 mg (30 mg Intramuscular Given 01/12/17 1754)     Initial Impression / Assessment and Plan / ED Course  I have reviewed the triage vital signs and the nursing notes.  Pertinent labs & imaging results that were available during my care of the patient were reviewed by me and considered in my medical decision making (see chart for details).     36 y.o. male here for multiple chronic complaints, for which he has seen multiple EDs and UCs over the last several years. His main complaint today is HA x1 day (of which it's the same as his headaches that have been intermittent for months), and dysuria x1wk. Also reports 2 months of intermittent ankle swelling that improve with elevation, and intermittent migratory paresthesias (had EMG/NCS 02/2015 which was unremarkable). Has had a myriad of work ups in the past, including recent thyroid testing (neg), HIV (neg), testosterone testing (unremarkable), and Hgb A1C testing (5.6) in the last year. On exam today, no focal neuro deficits, in NAD, extremities NVI with no appreciable diminished sensation areas, gait steady, b/l ankles with scant swelling but not really pitting edema, and doesn't really go much past the ankle. No  tachycardia/hypoxia/homan's sign/risk factors of DVT, highly doubt DVT. Highly doubt need for extensive work up, in this otherwise healthy male with chronic complaints and especially given recent outpatient unremarkable work ups. Will give toradol IM for headache, and will check STD testing and U/A in order to eval his c/o dysuria. Doubt need for further emergent work up with other labs/imaging/etc. Will reassess shortly.   7:29 PM Pt feeling much better after toradol. U/A completely unremarkable; dysuria could be from inadequate hydration. Advised use of tylenol/motrin for pain/headaches, staying hydrated, getting plenty of rest, elevate legs, use compression stockings if needed, and f/up with PCP in 1wk for recheck and ongoing management of his chronic complaints. Doubt need for empiric STD treatment today, will await test results. F/up with health dept for future STD concerns. Safe sex encouraged, and discussed having partners tested and treated before re-engaging in intercourse. I explained the diagnosis and have given explicit precautions to return to the ER including for any other new or worsening symptoms. The patient understands and accepts the medical plan as it's been dictated and I have answered their questions. Discharge instructions concerning home care and prescriptions have been given.  The patient is STABLE and is discharged to home in good condition.   Final Clinical Impressions(s) / ED Diagnoses   Final diagnoses:  Paresthesia  Chronic nonintractable headache, unspecified headache type  Dysuria  Peripheral edema    New Prescriptions New Prescriptions   No medications on [address] [place], [place] 01/12/17 1929    Gareth Morgan, MD 01/14/17 [phone]

## 2017-01-12 NOTE — Discharge Instructions (Signed)
A lot of your symptoms are chronic in nature, and you should continue to have ongoing testing/management by a primary care provider. Alternate between tylenol and motrin for pain or headaches. Keep your legs elevated and use compression stockings as needed for leg swelling. Stay well hydrated. You have been tested for gonorrhea, chlamydia, HIV, and Syphilis, and the hospital will call you if the lab is positive. DO NOT ENGAGE IN SEXUAL ACTIVITY UNTIL YOU FIND OUT ABOUT YOUR RESULTS AND HAVE PARTNERS TESTED AND TREATED. ALL PARTNERS MUST BE TESTED AND TREATED FOR STD'S. ALWAYS USE CONDOMS WHEN ENGAGING IN INTERCOURSE. [facility] for future STD concerns or screenings. This is the recommendation by the CDC for people with multiple sexual partners or history of STDs. Follow up with your regular doctor in 1 week for recheck of symptoms and ongoing management of your chronic complaints. Return to the emergency department for changes or worsening symptoms.

## 2017-01-12 NOTE — ED Triage Notes (Signed)
Pt has multiple complaints. Reports headaches, numbness/tingling to different areas on his body, occ abd pains and ankle swelling x 2 months. Denies n/v/d. No distress is noted at triage.

## 2017-01-13 LAB — HIV ANTIBODY (ROUTINE TESTING W REFLEX): HIV SCREEN 4TH GENERATION: NONREACTIVE

## 2017-01-14 LAB — GC/CHLAMYDIA PROBE AMP (~~LOC~~) NOT AT ARMC
Chlamydia: NEGATIVE
NEISSERIA GONORRHEA: NEGATIVE

## 2017-01-14 LAB — RPR: RPR: NONREACTIVE
